# Patient Record
Sex: Male | Born: 1955
Health system: Southern US, Community
[De-identification: ages and names within clinical notes are randomized; demographics above are authoritative.]

## PROBLEM LIST (undated history)

## (undated) DIAGNOSIS — M109 Gout, unspecified: Secondary | ICD-10-CM

## (undated) DIAGNOSIS — I10 Essential (primary) hypertension: Secondary | ICD-10-CM

## (undated) DIAGNOSIS — K219 Gastro-esophageal reflux disease without esophagitis: Secondary | ICD-10-CM

## (undated) HISTORY — PX: KNEE SURGERY: SHX244

---

## 2001-01-01 ENCOUNTER — Encounter: Payer: Self-pay | Admitting: Family Medicine

## 2001-01-01 ENCOUNTER — Encounter: Admission: RE | Admit: 2001-01-01 | Discharge: 2001-01-01 | Payer: Self-pay | Admitting: Family Medicine

## 2001-03-09 ENCOUNTER — Ambulatory Visit (HOSPITAL_COMMUNITY): Admission: RE | Admit: 2001-03-09 | Discharge: 2001-03-09 | Payer: Self-pay | Admitting: Gastroenterology

## 2006-09-14 ENCOUNTER — Ambulatory Visit (HOSPITAL_BASED_OUTPATIENT_CLINIC_OR_DEPARTMENT_OTHER): Admission: RE | Admit: 2006-09-14 | Discharge: 2006-09-14 | Payer: Self-pay | Admitting: Orthopedic Surgery

## 2013-03-31 ENCOUNTER — Other Ambulatory Visit: Payer: Self-pay | Admitting: Gastroenterology

## 2015-06-12 ENCOUNTER — Other Ambulatory Visit: Payer: Self-pay | Admitting: Dermatology

## 2015-10-15 ENCOUNTER — Other Ambulatory Visit: Payer: Self-pay | Admitting: Dermatology

## 2015-12-04 ENCOUNTER — Emergency Department (HOSPITAL_COMMUNITY)
Admission: EM | Admit: 2015-12-04 | Discharge: 2015-12-04 | Disposition: A | Discharge status: Home / Self Care | Payer: BLUE CROSS/BLUE SHIELD | Attending: Emergency Medicine | Admitting: Emergency Medicine

## 2015-12-04 ENCOUNTER — Encounter (HOSPITAL_COMMUNITY): Payer: Self-pay

## 2015-12-04 ENCOUNTER — Emergency Department (HOSPITAL_COMMUNITY): Payer: BLUE CROSS/BLUE SHIELD

## 2015-12-04 DIAGNOSIS — I10 Essential (primary) hypertension: Secondary | ICD-10-CM | POA: Insufficient documentation

## 2015-12-04 DIAGNOSIS — Z79899 Other long term (current) drug therapy: Secondary | ICD-10-CM | POA: Diagnosis not present

## 2015-12-04 DIAGNOSIS — R0789 Other chest pain: Secondary | ICD-10-CM | POA: Diagnosis not present

## 2015-12-04 DIAGNOSIS — R1012 Left upper quadrant pain: Secondary | ICD-10-CM | POA: Diagnosis not present

## 2015-12-04 DIAGNOSIS — R0781 Pleurodynia: Secondary | ICD-10-CM

## 2015-12-04 DIAGNOSIS — Z7982 Long term (current) use of aspirin: Secondary | ICD-10-CM | POA: Diagnosis not present

## 2015-12-04 DIAGNOSIS — R079 Chest pain, unspecified: Secondary | ICD-10-CM | POA: Diagnosis not present

## 2015-12-04 HISTORY — DX: Essential (primary) hypertension: I10

## 2015-12-04 LAB — URINALYSIS, ROUTINE W REFLEX MICROSCOPIC
BILIRUBIN URINE: NEGATIVE
GLUCOSE, UA: NEGATIVE mg/dL
Hgb urine dipstick: NEGATIVE
KETONES UR: NEGATIVE mg/dL
Leukocytes, UA: NEGATIVE
NITRITE: NEGATIVE
PROTEIN: NEGATIVE mg/dL
Specific Gravity, Urine: 1.012 (ref 1.005–1.030)
pH: 6.5 (ref 5.0–8.0)

## 2015-12-04 LAB — COMPREHENSIVE METABOLIC PANEL
ALBUMIN: 3.7 g/dL (ref 3.5–5.0)
ALT: 34 U/L (ref 17–63)
AST: 41 U/L (ref 15–41)
Alkaline Phosphatase: 95 U/L (ref 38–126)
Anion gap: 11 (ref 5–15)
BILIRUBIN TOTAL: 1.2 mg/dL (ref 0.3–1.2)
BUN: 7 mg/dL (ref 6–20)
CO2: 24 mmol/L (ref 22–32)
CREATININE: 0.69 mg/dL (ref 0.61–1.24)
Calcium: 10.2 mg/dL (ref 8.9–10.3)
Chloride: 102 mmol/L (ref 101–111)
GFR calc Af Amer: >60 mL/min (ref >60)
GLUCOSE: 103 mg/dL — AB (ref 65–99)
POTASSIUM: 4.1 mmol/L (ref 3.5–5.1)
Sodium: 137 mmol/L (ref 135–145)
TOTAL PROTEIN: 7.7 g/dL (ref 6.5–8.1)

## 2015-12-04 LAB — CBC
HCT: 43.1 % (ref 39.0–52.0)
Hemoglobin: 15.1 g/dL (ref 13.0–17.0)
MCH: 34.9 pg — ABNORMAL HIGH (ref 26.0–34.0)
MCHC: 35 g/dL (ref 30.0–36.0)
MCV: 99.5 fL (ref 78.0–100.0)
Platelets: 86 10*3/uL — ABNORMAL LOW (ref 150–400)
RBC: 4.33 MIL/uL (ref 4.22–5.81)
RDW: 12.9 % (ref 11.5–15.5)
WBC: 7.3 10*3/uL (ref 4.0–10.5)

## 2015-12-04 LAB — LIPASE, BLOOD: Lipase: 30 U/L (ref 11–51)

## 2015-12-04 LAB — D-DIMER, QUANTITATIVE: D-Dimer, Quant: 0.72 ug/mL-FEU — ABNORMAL HIGH (ref 0.00–0.50)

## 2015-12-04 MED ORDER — PREDNISONE 50 MG PO TABS: 50.0000 mg | ORAL_TABLET | Freq: Every day | ORAL | Status: AC

## 2015-12-04 MED ORDER — IOPAMIDOL (ISOVUE-370) INJECTION 76%
INTRAVENOUS | Status: AC
  Administered 2015-12-04: 100 mL
  Filled 2015-12-04: qty 100

## 2015-12-04 MED ORDER — GUAIFENESIN ER 1200 MG PO TB12: 1.0000 | ORAL_TABLET | Freq: Two times a day (BID) | ORAL | Status: AC

## 2015-12-04 MED ORDER — ACETAMINOPHEN-CODEINE 120-12 MG/5ML PO SOLN: 10.0000 mL | ORAL | Status: AC | PRN

## 2015-12-04 NOTE — Discharge Instructions (Signed)
Return here as needed.  Follow-up with your primary care doctor °

## 2015-12-04 NOTE — ED Provider Notes (Signed)
CSN: [id number]     Arrival date & time 12/04/15  0713 History   First MD Initiated Contact with Patient 12/04/15 0820     Chief Complaint  Patient presents with  . left side pain      (Consider location/radiation/quality/duration/timing/severity/associated sxs/prior Treatment) HPI Patient presents to the emergency department with left side pain that started last night.  The patient states that he developed this pain would resolve, but woke up at 2 AM with the pain.  States that he did take some over-the-counter medication without relief of the symptoms.  Patient states that he has more pain with deep breathing.  He states that he has pain at the lower left ribs and upper abdominal area. The patient denies shortness of breath, headache,blurred vision, neck pain, fever, cough, weakness, numbness, dizziness, anorexia, edema, nausea, vomiting, diarrhea, rash, back pain, dysuria, hematemesis, bloody stool, near syncope, or syncope. Past Medical History  Diagnosis Date  . Hypertension    History reviewed. No pertinent past surgical history. No family history on file. Social History  Substance Use Topics  . Smoking status: Never Smoker   . Smokeless tobacco: None  . Alcohol Use: None    Review of Systems  All other systems negative except as documented in the HPI. All pertinent positives and negatives as reviewed in the HPI.  Allergies  Review of patient's allergies indicates not on file.  Home Medications   Prior to Admission medications   Medication Sig Start Date End Date Taking? Authorizing Provider  allopurinol (ZYLOPRIM) 300 MG tablet Take 300 mg by mouth daily. 09/03/15  Yes Historical Provider, MD  aspirin 81 MG tablet Take 81 mg by mouth daily.   Yes Historical Provider, MD  cholecalciferol (VITAMIN D) 1000 units tablet Take 1,000 Units by mouth daily.   Yes Historical Provider, MD  CIALIS 5 MG tablet Take 5 mg by mouth daily as needed. Erectile dysfunction 10/06/15  Yes  Historical Provider, MD  KRILL OIL PO Take 1 capsule by mouth daily.   Yes Historical Provider, MD  losartan (COZAAR) 50 MG tablet Take 50 mg by mouth daily. 12/02/15  Yes Historical Provider, MD  Multiple Vitamins-Minerals (ZINC PO) Take 1 capsule by mouth daily.   Yes Historical Provider, MD  vitamin E 400 UNIT capsule Take 400 Units by mouth daily.   Yes Historical Provider, MD   BP 150/78 mmHg  Pulse 68  Temp(Src) 98.6 F (37 C) (Oral)  Resp 15  Ht 5\' 10"  (1.778 m)  Wt 113.399 kg  BMI 35.87 kg/m2  SpO2 95% Physical Exam  Constitutional: He is oriented to person, place, and time. He appears well-developed and well-nourished. No distress.  HENT:  Head: Normocephalic and atraumatic.  Mouth/Throat: Oropharynx is clear and moist.  Eyes: Pupils are equal, round, and reactive to light.  Neck: Normal range of motion. Neck supple.  Cardiovascular: Normal rate, regular rhythm and normal heart sounds.  Exam reveals no gallop and no friction rub.   No murmur heard. Pulmonary/Chest: Effort normal and breath sounds normal. No respiratory distress. He has no wheezes. He exhibits no tenderness.  Abdominal: Soft. Bowel sounds are normal. He exhibits no distension. There is no tenderness.  Musculoskeletal: He exhibits no edema.  Neurological: He is alert and oriented to person, place, and time. He exhibits normal muscle tone. Coordination normal.  Skin: Skin is warm and dry. No rash noted. No erythema.  Psychiatric: He has a normal mood and affect. His behavior is normal.  Nursing  note and vitals reviewed.   ED Course  Procedures (including critical care time) Labs Review Labs Reviewed  COMPREHENSIVE METABOLIC PANEL - Abnormal; Notable for the following:    Glucose, Bld 103 (*)    All other components within normal limits  CBC - Abnormal; Notable for the following:    MCH 34.9 (*)    Platelets 86 (*)    All other components within normal limits  D-DIMER, QUANTITATIVE (NOT AT [facility]) -  Abnormal; Notable for the following:    D-Dimer, Quant 0.72 (*)    All other components within normal limits  LIPASE, BLOOD  URINALYSIS, ROUTINE W REFLEX MICROSCOPIC (NOT AT [facility])    Imaging Review Dg Chest 2 View  12/04/2015  CLINICAL DATA:  Hypertension, cough for 1 week with worsening today, left lower chest pain EXAM: CHEST  2 VIEW COMPARISON:  None. FINDINGS: Borderline cardiomegaly. No acute infiltrate or pleural effusion. No pulmonary edema. Degenerative changes mid and lower thoracic spine. IMPRESSION: No active cardiopulmonary disease. Borderline cardiomegaly. Degenerative changes thoracic spine. Electronically Signed   By: Lahoma Crocker M.D.   On: 12/04/2015 09:09   Ct Angio Chest Pe W/cm &/or Wo Cm  12/04/2015  CLINICAL DATA:  60 year old male with left upper quadrant abdominal pain radiating to the back. Pleuritic pain. Initial encounter. EXAM: CT ANGIOGRAPHY CHEST CT ABDOMEN AND PELVIS WITH CONTRAST TECHNIQUE: Multidetector CT imaging of the chest was performed using the standard protocol during bolus administration of intravenous contrast. Multiplanar CT image reconstructions and MIPs were obtained to evaluate the vascular anatomy. Multidetector CT imaging of the abdomen and pelvis was performed using the standard protocol during bolus administration of intravenous contrast. CONTRAST:  100 mL Isovue 370 COMPARISON:  Chest radiographs 0909 hours today. FINDINGS: CTA CHEST FINDINGS Adequate contrast bolus timing in the pulmonary arterial tree. Motion artifact at both lung bases. This limits detail of the bilateral lower lobe and middle lobe distal pulmonary artery branches. No focal filling defect identified in the pulmonary arterial tree to suggest the presence of acute pulmonary embolism. Major airways are patent. Mild obscuration of lung markings throughout the lower chest due to the respiratory motion improved visualization of the lung bases on the dedicated abdomen images. Minimal dependent  atelectasis. No consolidation or confluent pulmonary opacity. No pleural effusion. No pericardial effusion. Calcified coronary artery atherosclerosis. Only minimal calcified aortic atherosclerosis in the chest. No thoracic lymphadenopathy. Widespread degenerative endplate spurring in the thoracic spine. Chronic left lateral seventh rib fracture. No acute osseous abnormality identified. CT ABDOMEN and PELVIS FINDINGS Degenerative endplate changes in the lumbar spine, including and upper lumbar inferior endplate Schmorl node as seen on sagittal image 73. Advanced degenerative changes at the medial right pubic symphysis with vacuum phenomena and osteophytosis. No acute osseous abnormality identified. No pelvic free fluid. Moderately distended but otherwise unremarkable urinary bladder. There is fluid in the distal colon and rectum which otherwise appears normal. Redundant but otherwise negative sigmoid colon. Decompressed left colon. There is some fluid in the transverse colon. Fluid at the hepatic flexure and in the ascending colon. No large intestine inflammation identified. Negative terminal ileum. Appendix not identified. No dilated small bowel. Decompressed stomach. Negative duodenum. No abdominal free air. No free fluid. Mildly distended but otherwise negative gallbladder. No pericholecystic inflammation. Mild decreased density throughout the liver in keeping with steatosis. Mildly nodular liver contour. Spleen size at the upper limits of normal. Portal venous system is patent. Negative pancreas and adrenal glands. Aortoiliac calcified atherosclerosis noted. Major arterial  structures are patent in the abdomen and pelvis. Bilateral renal contrast enhancement and excretion is normal. No lymphadenopathy. There is suggestion of some perigastric varices, most apparent in the gastro pad Eck ligament. Also the paraumbilical vein may be recannulized. Review of the MIP images confirms the above findings. IMPRESSION: 1.  Mildly motion degraded chest CTA with no acute pulmonary embolus identified. 2. No acute findings identified in the chest. 3. Calcified coronary atherosclerosis. 4. Suggestion of hepatic cirrhosis with small caliber perigastric varices. Is there known chronic liver disease? 5. No other acute or inflammatory process identified in the abdomen or pelvis. 6. Calcified aortic atherosclerosis. Electronically Signed   By: Genevie Ann M.D.   On: 12/04/2015 11:15   Ct Abdomen Pelvis W Contrast  12/04/2015  CLINICAL DATA:  60 year old male with left upper quadrant abdominal pain radiating to the back. Pleuritic pain. Initial encounter. EXAM: CT ANGIOGRAPHY CHEST CT ABDOMEN AND PELVIS WITH CONTRAST TECHNIQUE: Multidetector CT imaging of the chest was performed using the standard protocol during bolus administration of intravenous contrast. Multiplanar CT image reconstructions and MIPs were obtained to evaluate the vascular anatomy. Multidetector CT imaging of the abdomen and pelvis was performed using the standard protocol during bolus administration of intravenous contrast. CONTRAST:  100 mL Isovue 370 COMPARISON:  Chest radiographs 0909 hours today. FINDINGS: CTA CHEST FINDINGS Adequate contrast bolus timing in the pulmonary arterial tree. Motion artifact at both lung bases. This limits detail of the bilateral lower lobe and middle lobe distal pulmonary artery branches. No focal filling defect identified in the pulmonary arterial tree to suggest the presence of acute pulmonary embolism. Major airways are patent. Mild obscuration of lung markings throughout the lower chest due to the respiratory motion improved visualization of the lung bases on the dedicated abdomen images. Minimal dependent atelectasis. No consolidation or confluent pulmonary opacity. No pleural effusion. No pericardial effusion. Calcified coronary artery atherosclerosis. Only minimal calcified aortic atherosclerosis in the chest. No thoracic lymphadenopathy.  Widespread degenerative endplate spurring in the thoracic spine. Chronic left lateral seventh rib fracture. No acute osseous abnormality identified. CT ABDOMEN and PELVIS FINDINGS Degenerative endplate changes in the lumbar spine, including and upper lumbar inferior endplate Schmorl node as seen on sagittal image 73. Advanced degenerative changes at the medial right pubic symphysis with vacuum phenomena and osteophytosis. No acute osseous abnormality identified. No pelvic free fluid. Moderately distended but otherwise unremarkable urinary bladder. There is fluid in the distal colon and rectum which otherwise appears normal. Redundant but otherwise negative sigmoid colon. Decompressed left colon. There is some fluid in the transverse colon. Fluid at the hepatic flexure and in the ascending colon. No large intestine inflammation identified. Negative terminal ileum. Appendix not identified. No dilated small bowel. Decompressed stomach. Negative duodenum. No abdominal free air. No free fluid. Mildly distended but otherwise negative gallbladder. No pericholecystic inflammation. Mild decreased density throughout the liver in keeping with steatosis. Mildly nodular liver contour. Spleen size at the upper limits of normal. Portal venous system is patent. Negative pancreas and adrenal glands. Aortoiliac calcified atherosclerosis noted. Major arterial structures are patent in the abdomen and pelvis. Bilateral renal contrast enhancement and excretion is normal. No lymphadenopathy. There is suggestion of some perigastric varices, most apparent in the gastro pad Eck ligament. Also the paraumbilical vein may be recannulized. Review of the MIP images confirms the above findings. IMPRESSION: 1. Mildly motion degraded chest CTA with no acute pulmonary embolus identified. 2. No acute findings identified in the chest. 3. Calcified coronary atherosclerosis.  4. Suggestion of hepatic cirrhosis with small caliber perigastric varices. Is  there known chronic liver disease? 5. No other acute or inflammatory process identified in the abdomen or pelvis. 6. Calcified aortic atherosclerosis. Electronically Signed   By: Genevie Ann M.D.   On: 12/04/2015 11:15   I have personally reviewed and evaluated these images and lab results as part of my medical decision-making.   EKG Interpretation   Date/Time:  Tuesday Dec 04 2015 09:14:13 EDT Ventricular Rate:  61 PR Interval:  136 QRS Duration: 93 QT Interval:  402 QTC Calculation: 405 R Axis:   -30 Text Interpretation:  Sinus rhythm Left axis deviation No significant  change since last tracing Confirmed by Alvino Chapel  MD, Ovid Curd [phone]) on  12/04/2015 9:18:01 AM Also confirmed by Alvino Chapel  MD, NATHAN [phone]),  editor [place] CT, Leda Gauze [phone])  on 12/04/2015 9:23:58 AM      MDM   Final diagnoses:  None     Was concern for blood clot and intra-abdominal issues since the pain was located in the left upper quadrant and left lower chest region.  The patient does not have any CTA findings of PE and no significant intra-abdominal issues.  The patient is advised to follow up with his primary care Dr. told to return here as needed.  Patient agrees the plan and all questions were answered feel that this pain is most likely related to muscular irritation of the lower chest wall/pleuritic pleurisy type pain  Dalia Heading, PA-C 12/06/15 [place], MD 12/07/15 1859

## 2015-12-04 NOTE — ED Notes (Signed)
Pt transported to xray 

## 2015-12-04 NOTE — ED Notes (Signed)
Patient here with left side pain x 1 day, denies trauma. Nausea and some diarrhea with same. Pain slightly worse with ambulation

## 2016-03-26 DIAGNOSIS — N400 Enlarged prostate without lower urinary tract symptoms: Secondary | ICD-10-CM | POA: Diagnosis not present

## 2016-03-26 DIAGNOSIS — J309 Allergic rhinitis, unspecified: Secondary | ICD-10-CM | POA: Diagnosis not present

## 2016-03-26 DIAGNOSIS — I10 Essential (primary) hypertension: Secondary | ICD-10-CM | POA: Diagnosis not present

## 2016-03-26 DIAGNOSIS — Z0000 Encounter for general adult medical examination without abnormal findings: Secondary | ICD-10-CM | POA: Diagnosis not present

## 2016-03-26 DIAGNOSIS — M109 Gout, unspecified: Secondary | ICD-10-CM | POA: Diagnosis not present

## 2016-11-13 DIAGNOSIS — S46912A Strain of unspecified muscle, fascia and tendon at shoulder and upper arm level, left arm, initial encounter: Secondary | ICD-10-CM | POA: Diagnosis not present

## 2017-03-13 DIAGNOSIS — M109 Gout, unspecified: Secondary | ICD-10-CM | POA: Diagnosis not present

## 2017-04-27 DIAGNOSIS — Z13220 Encounter for screening for lipoid disorders: Secondary | ICD-10-CM | POA: Diagnosis not present

## 2017-04-27 DIAGNOSIS — Z0000 Encounter for general adult medical examination without abnormal findings: Secondary | ICD-10-CM | POA: Diagnosis not present

## 2017-04-27 DIAGNOSIS — Z23 Encounter for immunization: Secondary | ICD-10-CM | POA: Diagnosis not present

## 2017-07-29 DIAGNOSIS — R21 Rash and other nonspecific skin eruption: Secondary | ICD-10-CM | POA: Diagnosis not present

## 2017-10-20 DIAGNOSIS — L570 Actinic keratosis: Secondary | ICD-10-CM | POA: Diagnosis not present

## 2017-10-20 DIAGNOSIS — D229 Melanocytic nevi, unspecified: Secondary | ICD-10-CM | POA: Diagnosis not present

## 2017-10-20 DIAGNOSIS — L409 Psoriasis, unspecified: Secondary | ICD-10-CM | POA: Diagnosis not present

## 2017-11-23 ENCOUNTER — Other Ambulatory Visit: Payer: Self-pay | Admitting: Dermatology

## 2017-11-23 DIAGNOSIS — D0439 Carcinoma in situ of skin of other parts of face: Secondary | ICD-10-CM | POA: Diagnosis not present

## 2017-11-23 DIAGNOSIS — L309 Dermatitis, unspecified: Secondary | ICD-10-CM | POA: Diagnosis not present

## 2018-01-07 DIAGNOSIS — D0439 Carcinoma in situ of skin of other parts of face: Secondary | ICD-10-CM | POA: Diagnosis not present

## 2018-01-07 DIAGNOSIS — L409 Psoriasis, unspecified: Secondary | ICD-10-CM | POA: Diagnosis not present

## 2018-01-07 DIAGNOSIS — L570 Actinic keratosis: Secondary | ICD-10-CM | POA: Diagnosis not present

## 2018-03-01 DIAGNOSIS — L259 Unspecified contact dermatitis, unspecified cause: Secondary | ICD-10-CM | POA: Diagnosis not present

## 2018-03-04 DIAGNOSIS — L259 Unspecified contact dermatitis, unspecified cause: Secondary | ICD-10-CM | POA: Diagnosis not present

## 2018-04-28 DIAGNOSIS — I10 Essential (primary) hypertension: Secondary | ICD-10-CM | POA: Diagnosis not present

## 2018-04-28 DIAGNOSIS — D696 Thrombocytopenia, unspecified: Secondary | ICD-10-CM | POA: Diagnosis not present

## 2018-04-28 DIAGNOSIS — Z0000 Encounter for general adult medical examination without abnormal findings: Secondary | ICD-10-CM | POA: Diagnosis not present

## 2018-04-28 DIAGNOSIS — Z23 Encounter for immunization: Secondary | ICD-10-CM | POA: Diagnosis not present

## 2018-04-28 DIAGNOSIS — E781 Pure hyperglyceridemia: Secondary | ICD-10-CM | POA: Diagnosis not present

## 2018-04-28 DIAGNOSIS — M109 Gout, unspecified: Secondary | ICD-10-CM | POA: Diagnosis not present

## 2018-07-20 ENCOUNTER — Other Ambulatory Visit: Payer: Self-pay

## 2018-07-20 ENCOUNTER — Emergency Department (HOSPITAL_BASED_OUTPATIENT_CLINIC_OR_DEPARTMENT_OTHER): Payer: BLUE CROSS/BLUE SHIELD

## 2018-07-20 ENCOUNTER — Encounter (HOSPITAL_BASED_OUTPATIENT_CLINIC_OR_DEPARTMENT_OTHER): Payer: Self-pay | Admitting: *Deleted

## 2018-07-20 ENCOUNTER — Emergency Department (HOSPITAL_BASED_OUTPATIENT_CLINIC_OR_DEPARTMENT_OTHER)
Admission: EM | Admit: 2018-07-20 | Discharge: 2018-07-21 | Disposition: A | Discharge status: Home / Self Care | Payer: BLUE CROSS/BLUE SHIELD | Attending: Emergency Medicine | Admitting: Emergency Medicine

## 2018-07-20 DIAGNOSIS — K8020 Calculus of gallbladder without cholecystitis without obstruction: Secondary | ICD-10-CM

## 2018-07-20 DIAGNOSIS — R110 Nausea: Secondary | ICD-10-CM | POA: Diagnosis not present

## 2018-07-20 DIAGNOSIS — K7460 Unspecified cirrhosis of liver: Secondary | ICD-10-CM | POA: Diagnosis not present

## 2018-07-20 DIAGNOSIS — I10 Essential (primary) hypertension: Secondary | ICD-10-CM | POA: Diagnosis not present

## 2018-07-20 DIAGNOSIS — R1011 Right upper quadrant pain: Secondary | ICD-10-CM

## 2018-07-20 DIAGNOSIS — R1013 Epigastric pain: Secondary | ICD-10-CM | POA: Diagnosis present

## 2018-07-20 HISTORY — DX: Gastro-esophageal reflux disease without esophagitis: K21.9

## 2018-07-20 HISTORY — DX: Gout, unspecified: M10.9

## 2018-07-20 LAB — COMPREHENSIVE METABOLIC PANEL
ALBUMIN: 4 g/dL (ref 3.5–5.0)
ALK PHOS: 92 U/L (ref 38–126)
ALT: 33 U/L (ref 0–44)
ANION GAP: 10 (ref 5–15)
AST: 39 U/L (ref 15–41)
BUN: 14 mg/dL (ref 8–23)
CALCIUM: 9.2 mg/dL (ref 8.9–10.3)
CO2: 22 mmol/L (ref 22–32)
Chloride: 100 mmol/L (ref 98–111)
Creatinine, Ser: 0.92 mg/dL (ref 0.61–1.24)
GFR calc Af Amer: >60 mL/min (ref >60)
GFR calc non Af Amer: >60 mL/min (ref >60)
GLUCOSE: 111 mg/dL — AB (ref 70–99)
Potassium: 3.5 mmol/L (ref 3.5–5.1)
SODIUM: 132 mmol/L — AB (ref 135–145)
Total Bilirubin: 1.5 mg/dL — ABNORMAL HIGH (ref 0.3–1.2)
Total Protein: 7.8 g/dL (ref 6.5–8.1)

## 2018-07-20 LAB — CBC
HCT: 45.6 % (ref 39.0–52.0)
Hemoglobin: 15.9 g/dL (ref 13.0–17.0)
MCH: 35.4 pg — AB (ref 26.0–34.0)
MCHC: 34.9 g/dL (ref 30.0–36.0)
MCV: 101.6 fL — AB (ref 80.0–100.0)
PLATELETS: 72 10*3/uL — AB (ref 150–400)
RBC: 4.49 MIL/uL (ref 4.22–5.81)
RDW: 12.7 % (ref 11.5–15.5)
WBC: 10.1 10*3/uL (ref 4.0–10.5)
nRBC: 0 % (ref 0.0–0.2)

## 2018-07-20 LAB — TROPONIN I: Troponin I: <0.03 ng/mL (ref <0.03)

## 2018-07-20 LAB — LIPASE, BLOOD: Lipase: 43 U/L (ref 11–51)

## 2018-07-20 MED ORDER — ALUM & MAG HYDROXIDE-SIMETH 200-200-20 MG/5ML PO SUSP
30.0000 mL | Freq: Once | ORAL | Status: AC
  Administered 2018-07-20: 30 mL via ORAL
  Filled 2018-07-20: qty 30

## 2018-07-20 MED ORDER — IOPAMIDOL (ISOVUE-300) INJECTION 61%
100.0000 mL | Freq: Once | INTRAVENOUS | Status: AC | PRN
  Administered 2018-07-20: 100 mL via INTRAVENOUS

## 2018-07-20 MED ORDER — MORPHINE SULFATE (PF) 4 MG/ML IV SOLN
4.0000 mg | Freq: Once | INTRAVENOUS | Status: AC
  Administered 2018-07-20: 4 mg via INTRAVENOUS
  Filled 2018-07-20: qty 1

## 2018-07-20 MED ORDER — SODIUM CHLORIDE 0.9 % IV BOLUS
500.0000 mL | Freq: Once | INTRAVENOUS | Status: AC
  Administered 2018-07-20: 500 mL via INTRAVENOUS

## 2018-07-20 MED ORDER — ONDANSETRON HCL 4 MG/2ML IJ SOLN
4.0000 mg | Freq: Once | INTRAMUSCULAR | Status: AC
  Administered 2018-07-20: 4 mg via INTRAVENOUS
  Filled 2018-07-20: qty 2

## 2018-07-20 MED ORDER — LIDOCAINE VISCOUS HCL 2 % MT SOLN
15.0000 mL | Freq: Once | OROMUCOSAL | Status: AC
  Administered 2018-07-20: 15 mL via ORAL
  Filled 2018-07-20: qty 15

## 2018-07-20 MED ORDER — METOCLOPRAMIDE HCL 5 MG/ML IJ SOLN
10.0000 mg | Freq: Once | INTRAMUSCULAR | Status: AC
  Administered 2018-07-20: 10 mg via INTRAVENOUS
  Filled 2018-07-20: qty 2

## 2018-07-20 NOTE — ED Notes (Addendum)
Pt ambulated with assistance of wife to bathroom. Pt states he had bowel movement that was watery with no formed stool and black in color. Pt states he took pepto bismol at approx 1730 today

## 2018-07-20 NOTE — ED Notes (Signed)
Pt is vomiting, requesting additional pain medication and additional medication for nausea.

## 2018-07-20 NOTE — Discharge Instructions (Addendum)
You have been seen in the Emergency Department (ED) for abdominal pain.  Your evaluation did not identify a clear cause of your symptoms but was generally reassuring.  You do have evidence of chronic liver damage on the CT scan but your liver tests are normal. The ultrasound shows sludge and a stone in the gallbladder. This could cause pain and I have listed the name of a surgery office to call in the morning to schedule a follow up appointment.   Please follow up as instructed above regarding todays emergent visit and the symptoms that are bothering you.  Return to the ED if your abdominal pain worsens or fails to improve, you develop bloody vomiting, bloody diarrhea, you are unable to tolerate fluids due to vomiting, fever greater than 101, or other symptoms that concern you.

## 2018-07-20 NOTE — ED Provider Notes (Signed)
Emergency Department Provider Note   I have reviewed the triage vital signs and the nursing notes.   HISTORY  Chief Complaint Abdominal Pain   HPI Johnny Coleman is a 62 y.o. male with PMH of HTN and GERD presents to the emergency department for evaluation of severe epigastric pain.  Pain began suddenly this afternoon.  He states it feels sharp and radiates to the back.  No radiation of pain symptoms into the chest or lower abdomen.  He has had associated nausea and vomiting.  He states with episodes of emesis he does feel temporary relief in pain symptoms but then this returns.  He does not see a clear association with eating.  No prior history of similar pain.  No numbness or tingling in his lower or upper extremities. No fever or chills. Emesis is non-bloody.  Patient with one episode of diarrhea prior to leaving for the emergency department.   Past Medical History:  Diagnosis Date  . GERD (gastroesophageal reflux disease)   . Gout   . Hypertension     There are no active problems to display for this patient.   Past Surgical History:  Procedure Laterality Date  . KNEE SURGERY      Allergies Patient has no known allergies.  No family history on file.  Social History Social History   Tobacco Use  . Smoking status: Never Smoker  . Smokeless tobacco: Never Used  Substance Use Topics  . Alcohol use: Yes  . Drug use: Never    Review of Systems  Constitutional: No fever/chills Eyes: No visual changes. ENT: No sore throat. Cardiovascular: Denies chest pain. Respiratory: Denies shortness of breath. Gastrointestinal: Positive epigastric abdominal pain. Positive nausea, vomiting, and diarrhea.  No constipation. Genitourinary: Negative for dysuria. Musculoskeletal: Negative for back pain. Skin: Negative for rash. Neurological: Negative for headaches, focal weakness or numbness.  10-point ROS otherwise  negative.  ____________________________________________   PHYSICAL EXAM:  VITAL SIGNS: ED Triage Vitals  Enc Vitals Group     BP 07/20/18 1848 124/65     Pulse Rate 07/20/18 1846 82     Resp 07/20/18 1846 18     Temp 07/20/18 1846 97.6 F (36.4 C)     Temp Source 07/20/18 1846 Oral     SpO2 07/20/18 1846 100 %     Weight 07/20/18 1843 250 lb (113.4 kg)     Height 07/20/18 1843 5\' 10"  (1.778 m)     Pain Score 07/20/18 1843 8   Constitutional: Alert and oriented. Well appearing and in no acute distress. Eyes: Conjunctivae are normal.  Head: Atraumatic. Nose: No congestion/rhinnorhea. Mouth/Throat: Mucous membranes are moist.   Neck: No stridor.   Cardiovascular: Normal rate, regular rhythm. Good peripheral circulation. Grossly normal heart sounds.   Respiratory: Normal respiratory effort.  No retractions. Lungs CTAB. Gastrointestinal: Soft with focal mid-epigastric and RUQ tenderness. No LUQ tenderness. No distention.  Musculoskeletal: No lower extremity tenderness nor edema. No gross deformities of extremities. Neurologic:  Normal speech and language. No gross focal neurologic deficits are appreciated.  Skin:  Skin is warm, dry and intact. No rash noted.  ____________________________________________   LABS (all labs ordered are listed, but only abnormal results are displayed)  Labs Reviewed  COMPREHENSIVE METABOLIC PANEL - Abnormal; Notable for the following components:      Result Value   Sodium 132 (*)    Glucose, Bld 111 (*)    Total Bilirubin 1.5 (*)    All other components within  normal limits  CBC - Abnormal; Notable for the following components:   MCV 101.6 (*)    MCH 35.4 (*)    Platelets 72 (*)    All other components within normal limits  LIPASE, BLOOD  TROPONIN I   ____________________________________________  EKG   EKG Interpretation  Date/Time:  Tuesday July 20 2018 18:55:20 EST Ventricular Rate:  75 PR Interval:    QRS Duration: 92 QT  Interval:  395 QTC Calculation: 442 R Axis:   -19 Text Interpretation:  Sinus rhythm Borderline left axis deviation No STEMI.  Confirmed by Nanda Quinton [phone]) on 07/20/2018 7:18:21 PM Also confirmed by Nanda Quinton [phone]), editor Philomena Doheny [phone])  on 07/21/2018 7:35:22 AM       ____________________________________________  RADIOLOGY  Ct Abdomen Pelvis W Contrast  Result Date: 07/20/2018 CLINICAL DATA:  Sudden onset mid abdominal pain for the past 2 hours with vomiting. EXAM: CT ABDOMEN AND PELVIS WITH CONTRAST TECHNIQUE: Multidetector CT imaging of the abdomen and pelvis was performed using the standard protocol following bolus administration of intravenous contrast. CONTRAST:  134mL ISOVUE-300 IOPAMIDOL (ISOVUE-300) INJECTION 61% COMPARISON:  CT abdomen pelvis dated Dec 04, 2015. FINDINGS: Lower chest: No acute abnormality. Hepatobiliary: Unchanged cirrhotic liver. No focal liver abnormality. The gallbladder is mildly distended and contains a tiny gallstone. No gallbladder wall thickening or biliary dilatation. Pancreas: Unremarkable. No pancreatic ductal dilatation or surrounding inflammatory changes. Spleen: Normal in size without focal abnormality. Adrenals/Urinary Tract: Adrenal glands are unremarkable. Kidneys are normal, without renal calculi, focal lesion, or hydronephrosis. Bladder is unremarkable. Stomach/Bowel: Stomach is within normal limits. Appendix appears normal. No evidence of bowel wall thickening, distention, or inflammatory changes. Fluid-filled colon. Mild sigmoid diverticulosis. Vascular/Lymphatic: Unchanged splenorenal shunt and perigastric varices. Aortic atherosclerosis. No enlarged abdominal or pelvic lymph nodes. Reproductive: Prostate is unremarkable. Other: No abdominal wall hernia or abnormality. No abdominopelvic ascites. No pneumoperitoneum. Musculoskeletal: No acute or significant osseous findings. New Schmorl's node involving the L5 superior endplate.  IMPRESSION: 1.  No acute intra-abdominal process. 2. Cirrhosis with evidence of portal hypertension.  No ascites. 3. Cholelithiasis. 4.  Aortic atherosclerosis (ICD10-I70.0). Electronically Signed   By: Titus Dubin M.D.   On: 07/20/2018 20:36   US Abdomen Limited Ruq  Result Date: 07/20/2018 CLINICAL DATA:  Right upper quadrant pain.  Nausea and vomiting. EXAM: ULTRASOUND ABDOMEN LIMITED RIGHT UPPER QUADRANT COMPARISON:  CT earlier this day. FINDINGS: Gallbladder: Distended containing intraluminal sludge and tiny calculi. No gallbladder wall thickening. No pericholecystic fluid. Limited assessment for sonographic Percell Miller sign as patient had just received pain medication, no definite sonographic Murphy sign in this setting. Common bile duct: Diameter: 2 mm. Liver: Heterogeneous and increased echogenicity. Nodular hepatic contours. No discrete focal lesion demonstrated sonographically. Portal vein is patent on color Doppler imaging with normal direction of blood flow towards the liver. No right upper quadrant ascites. IMPRESSION: 1. Distended gallbladder containing sludge and tiny gallstone. No secondary findings of acute cholecystitis. 2. Hepatic cirrhosis. Electronically Signed   By: Keith Rake M.D.   On: 07/20/2018 23:26    ____________________________________________   PROCEDURES  Procedure(s) performed:   Procedures  None ____________________________________________   INITIAL IMPRESSION / ASSESSMENT AND PLAN / ED COURSE  Pertinent labs & imaging results that were available during my care of the patient were reviewed by me and considered in my medical decision making (see chart for details).  Patient presents to the emergency department with severe epigastric abdominal pain with nausea vomiting and diarrhea at home.  Symptoms appear to be gastrointestinal as opposed to atypical ACS or vascular etiology such as AAA or dissection. Patient with more focal tenderness over the RUQ.  Plan for CT abdomen/pelvis and labs. Patient given pain and nausea medication.   11:42 PM Patient is feeling slightly better after additional pain and nausea medication.  CT scan reviewed which shows liver cirrhosis of which the patient was unaware.  There is a small gallstone and distention of the gallbladder.  Lab work shows mild bilirubin elevation but normal white count and LFTs.  Right upper quadrant ultrasound obtained which shows sludge and distention but no acute cholecystitis.  Given that the patient is feeling slightly better I plan for GI cocktail and PO trial. If tolerating, will have the patient f/u with surgery as an outpatient. Patient and wife are comfortable with this plan.   Care transferred to Dr. Sherry Ruffing who will re-evaluate.  ____________________________________________  FINAL CLINICAL IMPRESSION(S) / ED DIAGNOSES  Final diagnoses:  RUQ pain  Symptomatic cholelithiasis  Nausea     MEDICATIONS GIVEN DURING THIS VISIT:  Medications  sodium chloride 0.9 % bolus 500 mL ( Intravenous Stopped 07/20/18 2013)  morphine 4 MG/ML injection 4 mg (4 mg Intravenous Given 07/20/18 1918)  ondansetron (ZOFRAN) injection 4 mg (4 mg Intravenous Given 07/20/18 1918)  iopamidol (ISOVUE-300) 61 % injection 100 mL (100 mLs Intravenous Contrast Given 07/20/18 2012)  morphine 4 MG/ML injection 4 mg (4 mg Intravenous Given 07/20/18 2046)  ondansetron (ZOFRAN) injection 4 mg (4 mg Intravenous Given 07/20/18 2047)  metoCLOPramide (REGLAN) injection 10 mg (10 mg Intravenous Given 07/20/18 2216)  morphine 4 MG/ML injection 4 mg (4 mg Intravenous Given 07/20/18 2216)  alum & mag hydroxide-simeth (MAALOX/MYLANTA) 200-200-20 MG/5ML suspension 30 mL (30 mLs Oral Given 07/20/18 2352)    And  lidocaine (XYLOCAINE) 2 % viscous mouth solution 15 mL (15 mLs Oral Given 07/20/18 2352)     NEW OUTPATIENT MEDICATIONS STARTED DURING THIS VISIT:  Discharge Medication List as of 07/21/2018 12:46 AM     START taking these medications   Details  ondansetron (ZOFRAN) 4 MG tablet Take 1 tablet (4 mg total) by mouth every 8 (eight) hours as needed for nausea or vomiting., Starting Wed 07/21/2018, Print    oxyCODONE (ROXICODONE) 5 MG immediate release tablet Take 1 tablet (5 mg total) by mouth every 4 (four) hours as needed for severe pain., Starting Wed 07/21/2018, Print        Note:  This document was prepared using Dragon voice recognition software and may include unintentional dictation errors.  Nanda Quinton, MD Emergency Medicine    Bernadean Saling, [facility], MD 07/21/18 [phone]

## 2018-07-20 NOTE — ED Triage Notes (Signed)
Mid abdominal pain x 4 hours. States for 2 months he has constantly clearing his throat. Vomiting started after the pain.

## 2018-07-20 NOTE — ED Notes (Signed)
Pt on monitor 

## 2018-07-20 NOTE — ED Notes (Signed)
Pt states that he had lunch at approx noon today and around 1400 he started having "severe epigastric abdominal pain with vomiting. He states he felt fine earlier today but the nausea came on suddenly with emesis and diaphoresis.

## 2018-07-21 MED ORDER — ONDANSETRON HCL 4 MG PO TABS: 4.0000 mg | ORAL_TABLET | Freq: Three times a day (TID) | ORAL | 0 refills | Status: AC | PRN

## 2018-07-21 MED ORDER — OXYCODONE HCL 5 MG PO TABS: 5.0000 mg | ORAL_TABLET | ORAL | 0 refills | Status: AC | PRN

## 2018-07-21 NOTE — ED Notes (Signed)
PO challenge per EDP. Gave pt saltine crackers and gingerale

## 2018-07-21 NOTE — ED Provider Notes (Signed)
12:09 AM Care assumed from Dr. Laverta Baltimore.  At time of transfer care, patient is awaiting reassessment after symptomatic management medications.  Suspect symptomatic cholelithiasis with nausea vomiting, abdominal pain in the setting of imaging revealing sludge and distended gallbladder.  No evidence of acute cholecystitis.  LFTs were reassuring however bilirubin was slightly more elevated than prior.  Plan of care at time of transfer of care is to p.o. challenge patient.  Patient is able to eat and drink safely and maintain hydration, he will be stable for discharge home for outpatient general surgery follow-up and prescription for pain and nausea medicine.  Patient agrees with this plan of care.  Anticipate reassessment after eating and drinking.  12:46 AM Patient was able to eat and drink without difficulty.  We discussed plan of care.  Patient will follow-up with general surgery and understood return precautions.  Patient to prescription for pain medicine nausea medicine.  Patient had no other questions or concerns and was discharged in good condition with improved symptoms.  Clinical Impression: 1. Symptomatic cholelithiasis   2. RUQ pain   3. Nausea     Disposition: Discharge  Condition: Good  I have discussed the results, Dx and Tx plan with the pt(& family if present). He/she/they expressed understanding and agree(s) with the plan. Discharge instructions discussed at great length. Strict return precautions discussed and pt &/or family have verbalized understanding of the instructions. No further questions at time of discharge.    New Prescriptions   ONDANSETRON (ZOFRAN) 4 MG TABLET    Take 1 tablet (4 mg total) by mouth every 8 (eight) hours as needed for nausea or vomiting.   OXYCODONE (ROXICODONE) 5 MG IMMEDIATE RELEASE TABLET    Take 1 tablet (5 mg total) by mouth every 4 (four) hours as needed for severe pain.    Follow Up: Lawerance Cruel, MD [place] [address] [phone]  Call in 1 day   Surgery, [facility] [address] [place] [address] [phone]  Call in 1 day to schedule a follow up appointment for evaluation of the gallbladder.     Lamario Mani, Gwenyth Allegra, MD 07/21/18 [phone]

## 2018-08-05 DIAGNOSIS — K766 Portal hypertension: Secondary | ICD-10-CM | POA: Diagnosis not present

## 2018-08-05 DIAGNOSIS — D696 Thrombocytopenia, unspecified: Secondary | ICD-10-CM | POA: Diagnosis not present

## 2018-08-05 DIAGNOSIS — K7460 Unspecified cirrhosis of liver: Secondary | ICD-10-CM | POA: Diagnosis not present

## 2018-08-13 DIAGNOSIS — K766 Portal hypertension: Secondary | ICD-10-CM | POA: Diagnosis not present

## 2018-08-13 DIAGNOSIS — D6959 Other secondary thrombocytopenia: Secondary | ICD-10-CM | POA: Diagnosis not present

## 2018-08-13 DIAGNOSIS — K8020 Calculus of gallbladder without cholecystitis without obstruction: Secondary | ICD-10-CM | POA: Diagnosis not present

## 2018-08-13 DIAGNOSIS — K7030 Alcoholic cirrhosis of liver without ascites: Secondary | ICD-10-CM | POA: Diagnosis not present

## 2018-11-04 DIAGNOSIS — K7460 Unspecified cirrhosis of liver: Secondary | ICD-10-CM | POA: Diagnosis not present

## 2018-11-04 DIAGNOSIS — D696 Thrombocytopenia, unspecified: Secondary | ICD-10-CM | POA: Diagnosis not present

## 2018-11-11 DIAGNOSIS — D696 Thrombocytopenia, unspecified: Secondary | ICD-10-CM | POA: Diagnosis not present

## 2018-11-11 DIAGNOSIS — F411 Generalized anxiety disorder: Secondary | ICD-10-CM | POA: Diagnosis not present

## 2018-11-11 DIAGNOSIS — I493 Ventricular premature depolarization: Secondary | ICD-10-CM | POA: Diagnosis not present

## 2018-11-11 DIAGNOSIS — K7460 Unspecified cirrhosis of liver: Secondary | ICD-10-CM | POA: Diagnosis not present

## 2018-11-23 DIAGNOSIS — M79662 Pain in left lower leg: Secondary | ICD-10-CM | POA: Diagnosis not present

## 2018-11-23 DIAGNOSIS — M25562 Pain in left knee: Secondary | ICD-10-CM | POA: Diagnosis not present

## 2018-12-07 DIAGNOSIS — M25562 Pain in left knee: Secondary | ICD-10-CM | POA: Diagnosis not present

## 2018-12-13 DIAGNOSIS — M25662 Stiffness of left knee, not elsewhere classified: Secondary | ICD-10-CM | POA: Diagnosis not present

## 2018-12-13 DIAGNOSIS — M6281 Muscle weakness (generalized): Secondary | ICD-10-CM | POA: Diagnosis not present

## 2018-12-13 DIAGNOSIS — M1712 Unilateral primary osteoarthritis, left knee: Secondary | ICD-10-CM | POA: Diagnosis not present

## 2019-02-16 DIAGNOSIS — K7460 Unspecified cirrhosis of liver: Secondary | ICD-10-CM | POA: Diagnosis not present

## 2019-02-18 DIAGNOSIS — K7460 Unspecified cirrhosis of liver: Secondary | ICD-10-CM | POA: Diagnosis not present

## 2019-03-11 DIAGNOSIS — Z1159 Encounter for screening for other viral diseases: Secondary | ICD-10-CM | POA: Diagnosis not present

## 2019-03-16 DIAGNOSIS — Z86010 Personal history of colonic polyps: Secondary | ICD-10-CM | POA: Diagnosis not present

## 2019-03-16 DIAGNOSIS — K5520 Angiodysplasia of colon without hemorrhage: Secondary | ICD-10-CM | POA: Diagnosis not present

## 2019-03-16 DIAGNOSIS — D128 Benign neoplasm of rectum: Secondary | ICD-10-CM | POA: Diagnosis not present

## 2019-03-16 DIAGNOSIS — K3189 Other diseases of stomach and duodenum: Secondary | ICD-10-CM | POA: Diagnosis not present

## 2019-03-16 DIAGNOSIS — K5730 Diverticulosis of large intestine without perforation or abscess without bleeding: Secondary | ICD-10-CM | POA: Diagnosis not present

## 2019-03-16 DIAGNOSIS — I8510 Secondary esophageal varices without bleeding: Secondary | ICD-10-CM | POA: Diagnosis not present

## 2019-03-16 DIAGNOSIS — K269 Duodenal ulcer, unspecified as acute or chronic, without hemorrhage or perforation: Secondary | ICD-10-CM | POA: Diagnosis not present

## 2019-03-16 DIAGNOSIS — K766 Portal hypertension: Secondary | ICD-10-CM | POA: Diagnosis not present

## 2019-03-16 DIAGNOSIS — D123 Benign neoplasm of transverse colon: Secondary | ICD-10-CM | POA: Diagnosis not present

## 2019-03-16 DIAGNOSIS — K759 Inflammatory liver disease, unspecified: Secondary | ICD-10-CM | POA: Diagnosis not present

## 2019-05-31 DIAGNOSIS — Z23 Encounter for immunization: Secondary | ICD-10-CM | POA: Diagnosis not present

## 2019-06-01 DIAGNOSIS — D696 Thrombocytopenia, unspecified: Secondary | ICD-10-CM | POA: Diagnosis not present

## 2019-06-01 DIAGNOSIS — M109 Gout, unspecified: Secondary | ICD-10-CM | POA: Diagnosis not present

## 2019-06-01 DIAGNOSIS — K766 Portal hypertension: Secondary | ICD-10-CM | POA: Diagnosis not present

## 2019-06-01 DIAGNOSIS — K7460 Unspecified cirrhosis of liver: Secondary | ICD-10-CM | POA: Diagnosis not present

## 2019-06-07 DIAGNOSIS — D696 Thrombocytopenia, unspecified: Secondary | ICD-10-CM | POA: Diagnosis not present

## 2019-06-07 DIAGNOSIS — M109 Gout, unspecified: Secondary | ICD-10-CM | POA: Diagnosis not present

## 2019-06-07 DIAGNOSIS — K766 Portal hypertension: Secondary | ICD-10-CM | POA: Diagnosis not present

## 2019-10-18 ENCOUNTER — Other Ambulatory Visit: Payer: Self-pay

## 2019-10-18 MED ORDER — CLOBETASOL PROPIONATE 0.05 % EX FOAM: Freq: Every day | CUTANEOUS | 5 refills | Status: AC

## 2019-10-18 NOTE — Telephone Encounter (Signed)
Received fax for denial of Lexette.  Per Dr. Denna Haggard okay to change Rx to Clobetasol Foam.

## 2019-12-05 DIAGNOSIS — F411 Generalized anxiety disorder: Secondary | ICD-10-CM | POA: Diagnosis not present

## 2020-01-18 DIAGNOSIS — H5203 Hypermetropia, bilateral: Secondary | ICD-10-CM | POA: Diagnosis not present

## 2020-02-09 DIAGNOSIS — H5203 Hypermetropia, bilateral: Secondary | ICD-10-CM | POA: Diagnosis not present

## 2020-03-30 DIAGNOSIS — D696 Thrombocytopenia, unspecified: Secondary | ICD-10-CM | POA: Diagnosis not present

## 2020-03-30 DIAGNOSIS — Z0000 Encounter for general adult medical examination without abnormal findings: Secondary | ICD-10-CM | POA: Diagnosis not present

## 2020-03-30 DIAGNOSIS — Z23 Encounter for immunization: Secondary | ICD-10-CM | POA: Diagnosis not present

## 2020-05-09 DIAGNOSIS — U071 COVID-19: Secondary | ICD-10-CM | POA: Diagnosis not present

## 2020-05-12 ENCOUNTER — Other Ambulatory Visit (HOSPITAL_COMMUNITY): Payer: Self-pay | Admitting: Adult Health

## 2020-05-12 ENCOUNTER — Ambulatory Visit (HOSPITAL_COMMUNITY)
Admission: RE | Admit: 2020-05-12 | Discharge: 2020-05-12 | Disposition: A | Discharge status: Home / Self Care | Payer: Medicare Other | Source: Ambulatory Visit | Attending: Pulmonary Disease | Admitting: Pulmonary Disease

## 2020-05-12 DIAGNOSIS — U071 COVID-19: Secondary | ICD-10-CM | POA: Diagnosis present

## 2020-05-12 DIAGNOSIS — Z23 Encounter for immunization: Secondary | ICD-10-CM | POA: Diagnosis not present

## 2020-05-12 MED ORDER — FAMOTIDINE IN NACL 20-0.9 MG/50ML-% IV SOLN
20.0000 mg | Freq: Once | INTRAVENOUS | Status: AC | PRN
  Administered 2020-05-12: 20 mg via INTRAVENOUS
  Filled 2020-05-12: qty 50

## 2020-05-12 MED ORDER — SODIUM CHLORIDE 0.9 % IV SOLN: INTRAVENOUS | Status: DC | PRN

## 2020-05-12 MED ORDER — METHYLPREDNISOLONE SODIUM SUCC 125 MG IJ SOLR
125.0000 mg | Freq: Once | INTRAMUSCULAR | Status: AC | PRN
  Administered 2020-05-12: 125 mg via INTRAVENOUS
  Filled 2020-05-12: qty 2

## 2020-05-12 MED ORDER — SODIUM CHLORIDE 0.9 % IV SOLN: Freq: Once | INTRAVENOUS | Status: AC

## 2020-05-12 MED ORDER — DIPHENHYDRAMINE HCL 50 MG/ML IJ SOLN: 50.0000 mg | Freq: Once | INTRAMUSCULAR | Status: DC | PRN

## 2020-05-12 MED ORDER — SODIUM CHLORIDE 0.9 % IV SOLN: Freq: Once | INTRAVENOUS | Status: DC

## 2020-05-12 MED ORDER — ALBUTEROL SULFATE HFA 108 (90 BASE) MCG/ACT IN AERS: 2.0000 | INHALATION_SPRAY | Freq: Once | RESPIRATORY_TRACT | Status: DC | PRN

## 2020-05-12 MED ORDER — EPINEPHRINE 0.3 MG/0.3ML IJ SOAJ: 0.3000 mg | Freq: Once | INTRAMUSCULAR | Status: DC | PRN

## 2020-05-12 NOTE — Progress Notes (Signed)
I connected by phone with Johnny Coleman on 05/12/2020 at 7:53 AM to discuss the potential use of a new treatment for mild to moderate COVID-19 viral infection in non-hospitalized patients.  This patient is a 64 y.o. male that meets the FDA criteria for Emergency Use Authorization of COVID monoclonal antibody casirivimab/imdevimab or bamlanivimab/eteseviamb.  Has a (+) direct SARS-CoV-2 viral test result  Has mild or moderate COVID-19   Is NOT hospitalized due to COVID-19  Is within 10 days of symptom onset  Has at least one of the high risk factor(s) for progression to severe COVID-19 and/or hospitalization as defined in EUA.  Specific high risk criteria : Older age (>/= 65 yo)   Sx onset 10/4   I have spoken and communicated the following to the patient or parent/caregiver regarding COVID monoclonal antibody treatment:  1. FDA has authorized the emergency use for the treatment of mild to moderate COVID-19 in adults and pediatric patients with positive results of direct SARS-CoV-2 viral testing who are 61 years of age and older weighing at least 40 kg, and who are at high risk for progressing to severe COVID-19 and/or hospitalization.  2. The significant known and potential risks and benefits of COVID monoclonal antibody, and the extent to which such potential risks and benefits are unknown.  3. Information on available alternative treatments and the risks and benefits of those alternatives, including clinical trials.  4. Patients treated with COVID monoclonal antibody should continue to self-isolate and use infection control measures (e.g., wear mask, isolate, social distance, avoid sharing personal items, clean and disinfect "high touch" surfaces, and frequent handwashing) according to CDC guidelines.   5. The patient or parent/caregiver has the option to accept or refuse COVID monoclonal antibody treatment.  After reviewing this information with the patient, the patient has agreed  to receive one of the available covid 19 monoclonal antibodies and will be provided an appropriate fact sheet prior to infusion. Scot Dock, NP 05/12/2020 7:53 AM

## 2020-05-12 NOTE — Discharge Instructions (Signed)

## 2020-05-12 NOTE — Progress Notes (Signed)
Patient was started on the Casi infusion and 5 mins into the infusion he started having lower back pain and midsternum chest pain. The medication was immediately stopped and Np Thedore Mins was notified. Verbal orders were given to take vitals, administer the standing prn medications pepcid and solu medrol. Once medications administered continue the casi infusion.

## 2020-05-12 NOTE — Progress Notes (Signed)
  Diagnosis: COVID-19  Physician: Dr. Joya Gaskins   Procedure: [facility] Infusion [facility] Med: casirivimab\imdevimab infusion - Provided patient with casirivimab\imdevimab fact sheet for patients, parents and caregivers prior to infusion.  Complications: No immediate complications noted. See previous note from 1230pm 05/12/20  Discharge: Discharged home   Johnny Coleman 05/12/2020

## 2020-05-15 ENCOUNTER — Other Ambulatory Visit (HOSPITAL_COMMUNITY): Payer: Self-pay

## 2020-05-28 IMAGING — CT CT ABD-PELV W/ CM
2 of 5 series · 16 of 46 positions shown, 18 images · IV contrast (APPLIED)
Comparison: CT abdomen pelvis dated December 04, 2015.

CLINICAL DATA: Sudden onset mid abdominal pain for the past 2 hours
with vomiting.

EXAM:
CT ABDOMEN AND PELVIS WITH CONTRAST
TECHNIQUE: Multidetector CT imaging of the abdomen and pelvis was performed
using the standard protocol following bolus administration of
intravenous contrast.
CONTRAST:  100mL HZTKG5-PAA IOPAMIDOL (HZTKG5-PAA) INJECTION 61%

[Series 2: axial st · axial · 0.89mm/px · z∈[+367,+817]mm · 13 of 102 slices shown, 15 images]
[im 6/102  soft-tissue]
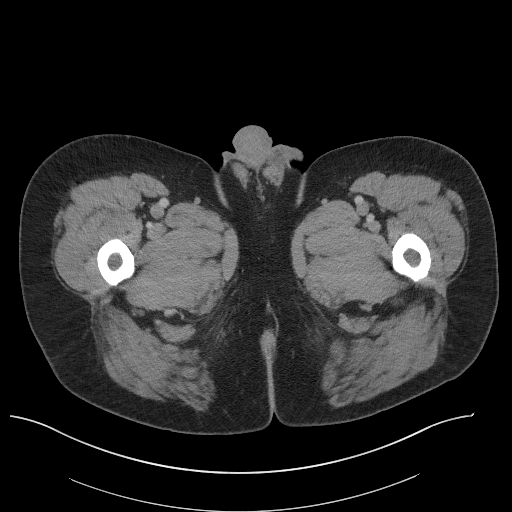
[im 6/102  bone]
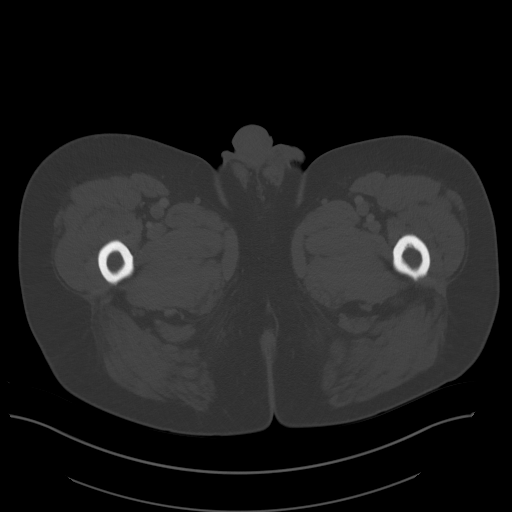
[im 16/102  soft-tissue]
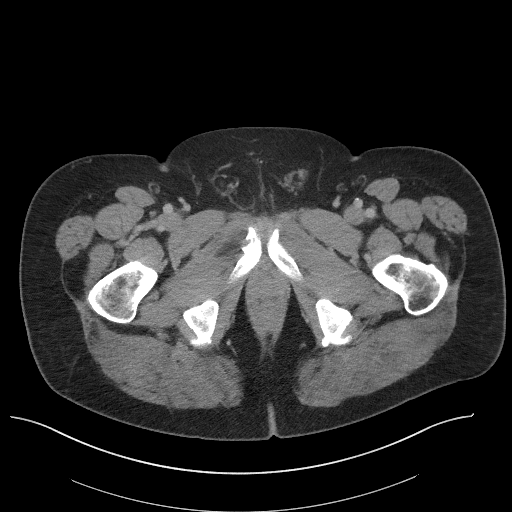
[im 22/102  soft-tissue]
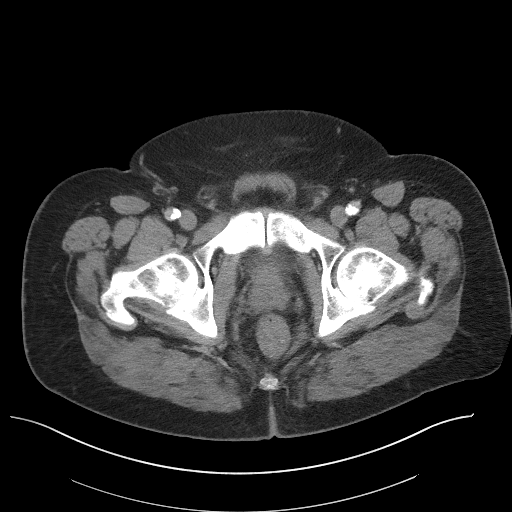
[im 27/102  soft-tissue]
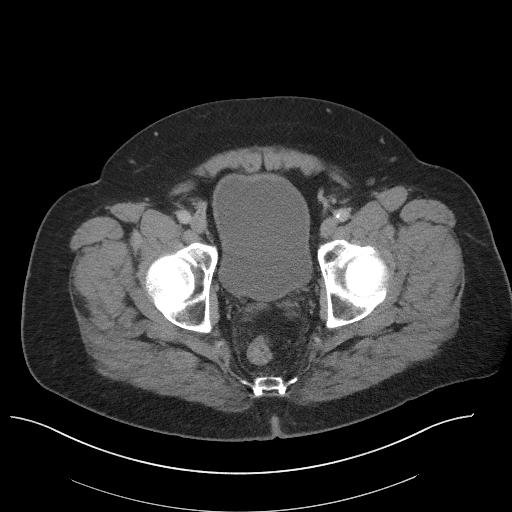
[im 38/102  soft-tissue]
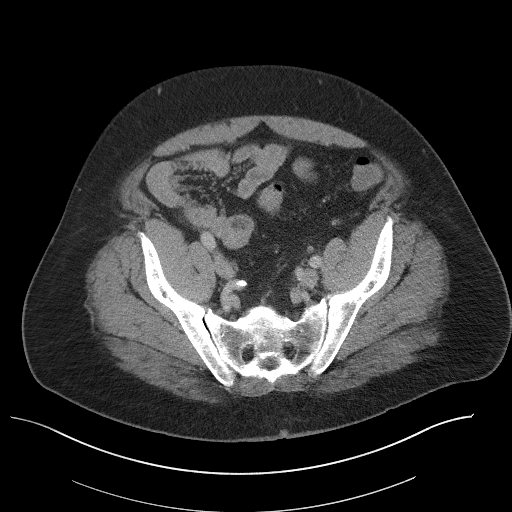
[im 43/102  soft-tissue]
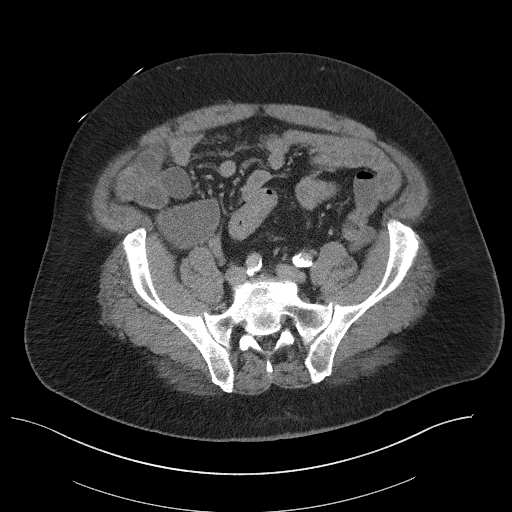
[im 54/102  soft-tissue]
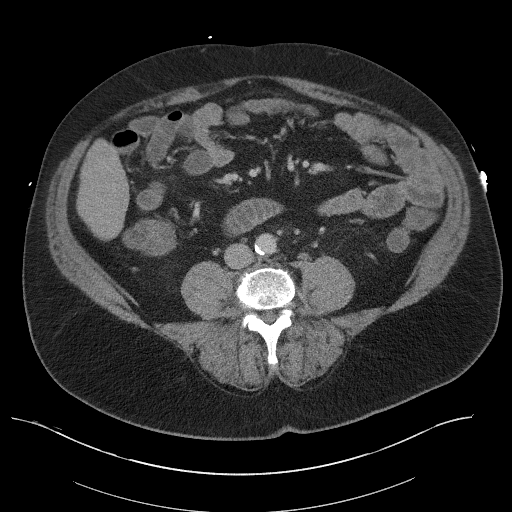
[im 59/102  soft-tissue]
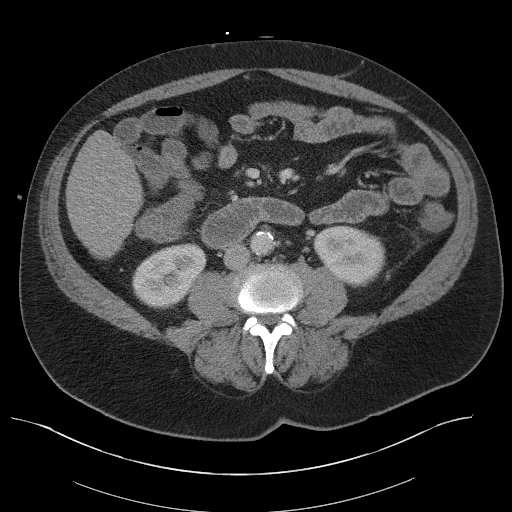
[im 64/102  soft-tissue]
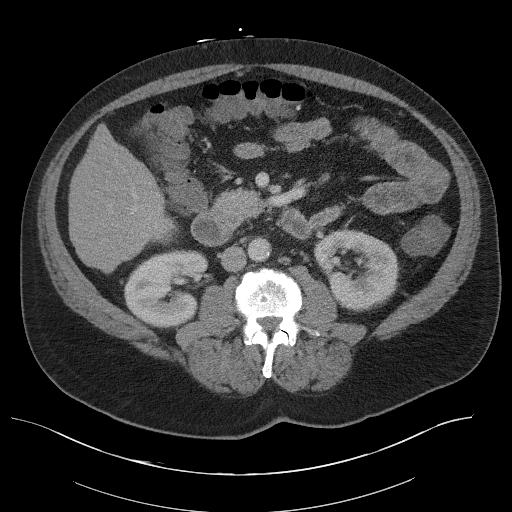
[im 64/102  bone]
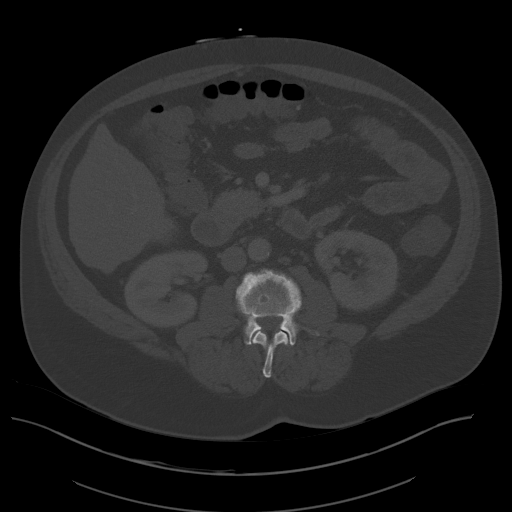
[im 75/102  soft-tissue]
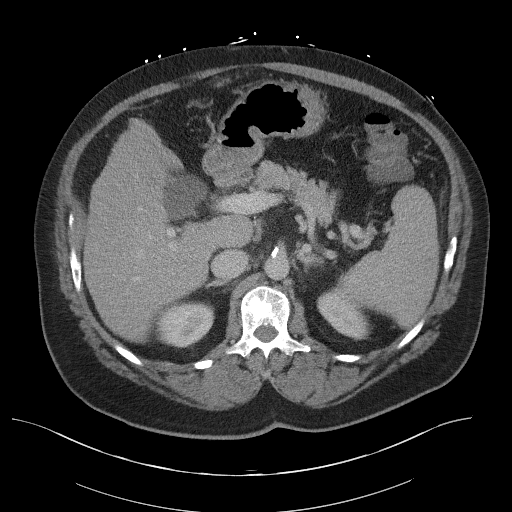
[im 80/102  soft-tissue]
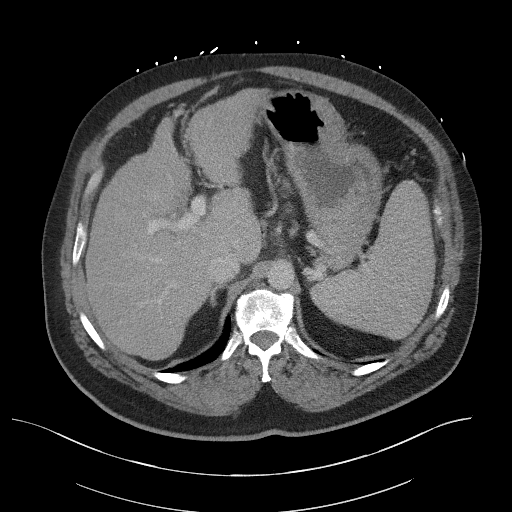
[im 86/102  soft-tissue]
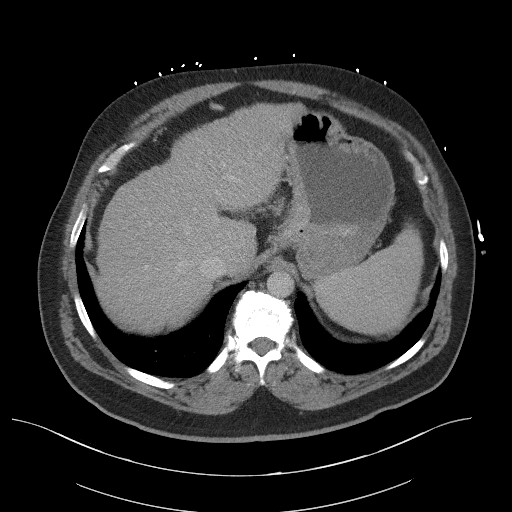
[im 96/102  soft-tissue]
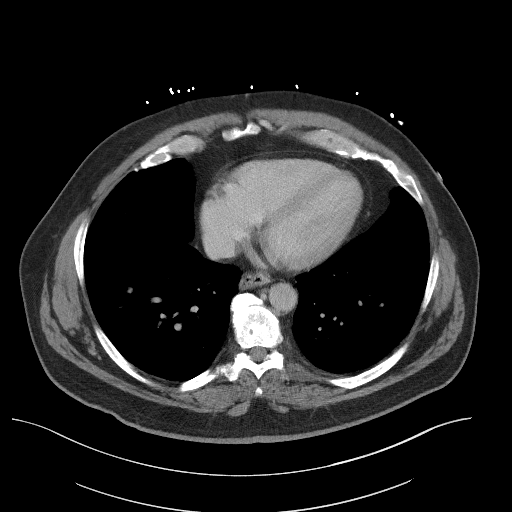

[Series 5: coronal st · coronal · 0.89mm/px · 3 of 112 slices shown]
[im 38/112  soft-tissue]
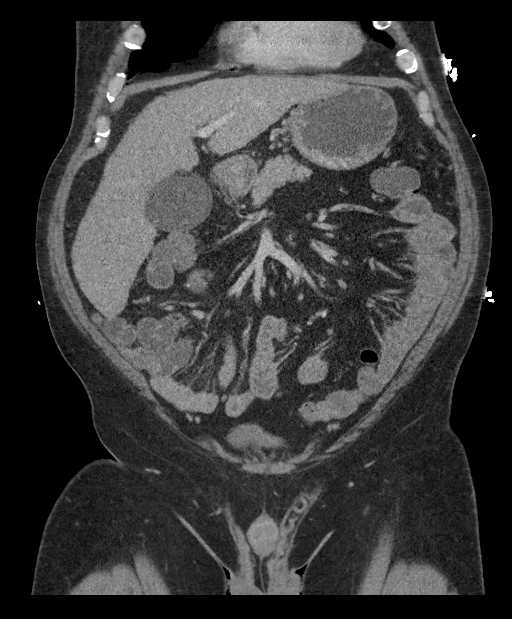
[im 50/112  soft-tissue]
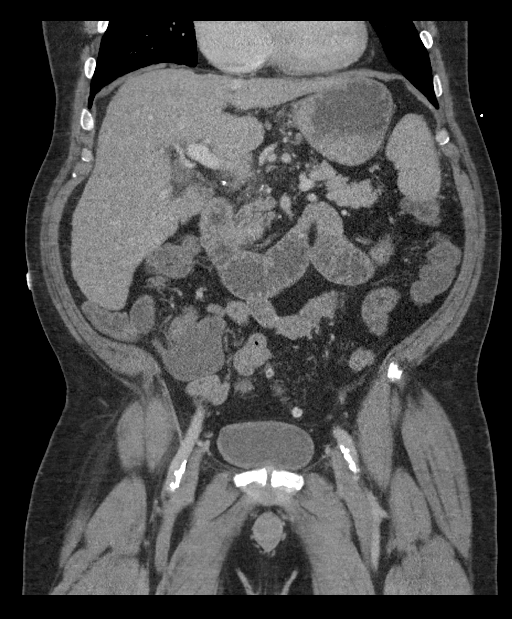
[im 62/112  soft-tissue]
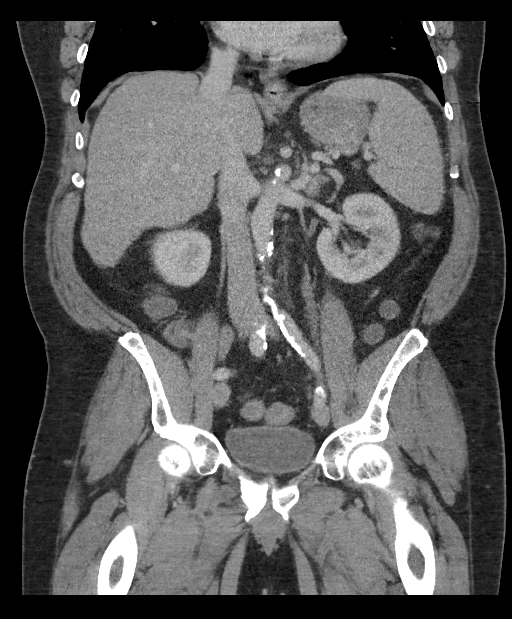

[16 of 46 positions shown; findings below may reference images not displayed]

FINDINGS: Lower chest: No acute abnormality.

Hepatobiliary: Unchanged cirrhotic liver. No focal liver
abnormality. The gallbladder is mildly distended and contains a tiny
gallstone. No gallbladder wall thickening or biliary dilatation.

Pancreas: Unremarkable. No pancreatic ductal dilatation or
surrounding inflammatory changes.

Spleen: Normal in size without focal abnormality.

Adrenals/Urinary Tract: Adrenal glands are unremarkable. Kidneys are
normal, without renal calculi, focal lesion, or hydronephrosis.
Bladder is unremarkable.

Stomach/Bowel: Stomach is within normal limits. Appendix appears
normal. No evidence of bowel wall thickening, distention, or
inflammatory changes. Fluid-filled colon. Mild sigmoid
diverticulosis.

Vascular/Lymphatic: Unchanged splenorenal shunt and perigastric
varices. Aortic atherosclerosis. No enlarged abdominal or pelvic
lymph nodes.

Reproductive: Prostate is unremarkable.

Other: No abdominal wall hernia or abnormality. No abdominopelvic
ascites. No pneumoperitoneum.

Musculoskeletal: No acute or significant osseous findings. New
Schmorl's node involving the L5 superior endplate.
IMPRESSION: 1.  No acute intra-abdominal process.
2. Cirrhosis with evidence of portal hypertension.  No ascites.
3. Cholelithiasis.
4.  Aortic atherosclerosis ([id number]).

## 2021-01-16 ENCOUNTER — Other Ambulatory Visit: Payer: Self-pay

## 2021-01-16 ENCOUNTER — Encounter: Payer: Self-pay | Admitting: Dermatology

## 2021-01-16 ENCOUNTER — Ambulatory Visit: Payer: Medicare Other | Admitting: Dermatology

## 2021-01-16 DIAGNOSIS — C44319 Basal cell carcinoma of skin of other parts of face: Secondary | ICD-10-CM | POA: Diagnosis not present

## 2021-01-16 DIAGNOSIS — Z1283 Encounter for screening for malignant neoplasm of skin: Secondary | ICD-10-CM

## 2021-01-16 DIAGNOSIS — D485 Neoplasm of uncertain behavior of skin: Secondary | ICD-10-CM | POA: Diagnosis not present

## 2021-01-16 DIAGNOSIS — C4491 Basal cell carcinoma of skin, unspecified: Secondary | ICD-10-CM

## 2021-01-16 DIAGNOSIS — L821 Other seborrheic keratosis: Secondary | ICD-10-CM

## 2021-01-16 DIAGNOSIS — L400 Psoriasis vulgaris: Secondary | ICD-10-CM

## 2021-01-16 DIAGNOSIS — B359 Dermatophytosis, unspecified: Secondary | ICD-10-CM

## 2021-01-16 HISTORY — DX: Basal cell carcinoma of skin, unspecified: C44.91

## 2021-01-16 LAB — POCT SKIN KOH: Skin KOH, POC: NEGATIVE

## 2021-01-16 NOTE — Patient Instructions (Addendum)
Call in 1 month     Biopsy, Surgery (Curettage) & Surgery (Excision) Aftercare Instructions  1. Okay to remove bandage in 24 hours  2. Wash area with soap and water  3. Apply Vaseline to area twice daily until healed (Not Neosporin)  4. Okay to cover with a Band-Aid to decrease the chance of infection or prevent irritation from clothing; also it's okay to uncover lesion at home.  5. Suture instructions: return to our office in 7-10 or 10-14 days for a nurse visit for suture removal. Variable healing with sutures, if pain or itching occurs call our office. It's okay to shower or bathe 24 hours after sutures are given.  6. The following risks may occur after a biopsy, curettage or excision: bleeding, scarring, discoloration, recurrence, infection (redness, yellow drainage, pain or swelling).  7. For questions, concerns and results call our office at [place] before 4pm & Friday before 3pm. Biopsy results will be available in 1 week.

## 2021-01-21 ENCOUNTER — Encounter: Payer: Self-pay | Admitting: Dermatology

## 2021-01-21 NOTE — Progress Notes (Signed)
   Follow-Up Visit   Subjective  Johnny Coleman is a 65 y.o. male who presents for the following: Psoriasis (Right foot only x years treatment clobetasol foam due to price change he cant afford it now).  Rash on foot previously labeled as psoriasis and treated with clobetasol with some improvement. Location:  Duration:  Quality:  Associated Signs/Symptoms: Modifying Factors:  Severity:  Timing: Context:   Objective  Well appearing patient in no apparent distress; mood and affect are within normal limits. Multiple 2 to 10 mm brown flattopped textured papules  General skin examination, no atypical pigmented lesions.  Left Zygomatic Area Pearly pink 6 mm papule     Right Foot - Anterior Marginated papulosquamous 2cm spot; careful KOH did not prove tinea.  No sign of tinea toe webs or plantar.  Neck - Posterior Objectively, there is no typical psoriasis present today.  Obviously, I cannot exclude the possibility that this is in remission.    A full examination was performed including scalp, head, eyes, ears, nose, lips, neck, chest, axillae, abdomen, back, buttocks, bilateral upper extremities, bilateral lower extremities, hands, feet, fingers, toes, fingernails, and toenails. All findings within normal limits unless otherwise noted below.  Areas beneath undergarments not fully examined.   Assessment & Plan    Neoplasm of uncertain behavior of skin Left Zygomatic Area  Skin / nail biopsy Type of biopsy: tangential   Informed consent: discussed and consent obtained   Timeout: patient name, date of birth, surgical site, and procedure verified   Anesthesia: the lesion was anesthetized in a standard fashion   Anesthetic:  1% lidocaine w/ epinephrine 1-100,000 local infiltration Instrument used: flexible razor blade   Hemostasis achieved with: ferric subsulfate   Outcome: patient tolerated procedure well   Post-procedure details: wound care instructions given     Specimen 1 - Surgical pathology Differential Diagnosis: scc vs bcc  Check Margins: No  Tinea Right Foot - Anterior  OTC clotrimazole cream apply daily after bathing; follow up in 1 month.   POCT Skin KOH - Right Foot - Anterior  Culture, fungus without smear - Right Foot - Anterior  Seborrheic keratosis  No intervention currently needed  Screening for malignant neoplasm of skin  Annual skin examination  Plaque psoriasis Neck - Posterior  No psoriasis treatment currently indicated.      I, Lavonna Monarch, MD, have reviewed all documentation for this visit.  The documentation on 01/21/21 for the exam, diagnosis, procedures, and orders are all accurate and complete.

## 2021-01-28 ENCOUNTER — Telehealth: Payer: Self-pay

## 2021-01-28 NOTE — Telephone Encounter (Signed)
Patient is returning phone call about pathology results.

## 2021-01-28 NOTE — Telephone Encounter (Signed)
Phone call to patient with his pathology results. Voicemail left for patient to give the office a call back.  ?

## 2021-01-28 NOTE — Telephone Encounter (Signed)
-----   Message from Lavonna Monarch, MD sent at 01/24/2021  7:32 PM EDT ----- Schedule surgery with Dr. Darene Lamer

## 2021-01-29 NOTE — Telephone Encounter (Signed)
Phone call to patient with his pathology results. Patient aware of results.  

## 2021-03-04 LAB — CULTURE, FUNGUS WITHOUT SMEAR
MICRO NUMBER:: 12010813
SPECIMEN QUALITY:: ADEQUATE

## 2021-03-07 ENCOUNTER — Telehealth: Payer: Self-pay

## 2021-03-07 DIAGNOSIS — H52221 Regular astigmatism, right eye: Secondary | ICD-10-CM | POA: Diagnosis not present

## 2021-03-07 DIAGNOSIS — H25811 Combined forms of age-related cataract, right eye: Secondary | ICD-10-CM | POA: Diagnosis not present

## 2021-03-07 NOTE — Telephone Encounter (Signed)
-----   Message from Lavonna Monarch, MD sent at 03/04/2021  7:34 PM EDT ----- I did research.  Although dematiaceous fungus may occasionally cause toenail infections, it would rarely involve the skin of immunocompetent patients I do not feel this result requires intervention.

## 2021-03-07 NOTE — Telephone Encounter (Signed)
Phone call to patient with his culture results and Dr. Onalee Hua recommendations. Patient aware.

## 2021-03-21 ENCOUNTER — Encounter: Payer: Self-pay | Admitting: Dermatology

## 2021-03-21 ENCOUNTER — Ambulatory Visit (INDEPENDENT_AMBULATORY_CARE_PROVIDER_SITE_OTHER): Payer: Medicare Other | Admitting: Dermatology

## 2021-03-21 ENCOUNTER — Other Ambulatory Visit: Payer: Self-pay

## 2021-03-21 DIAGNOSIS — C44319 Basal cell carcinoma of skin of other parts of face: Secondary | ICD-10-CM

## 2021-03-21 DIAGNOSIS — C4491 Basal cell carcinoma of skin, unspecified: Secondary | ICD-10-CM

## 2021-03-21 NOTE — Patient Instructions (Signed)

## 2021-03-22 DIAGNOSIS — H25811 Combined forms of age-related cataract, right eye: Secondary | ICD-10-CM | POA: Diagnosis not present

## 2021-03-22 DIAGNOSIS — H2511 Age-related nuclear cataract, right eye: Secondary | ICD-10-CM | POA: Diagnosis not present

## 2021-03-22 DIAGNOSIS — H40051 Ocular hypertension, right eye: Secondary | ICD-10-CM | POA: Diagnosis not present

## 2021-03-22 DIAGNOSIS — H409 Unspecified glaucoma: Secondary | ICD-10-CM | POA: Diagnosis not present

## 2021-03-28 ENCOUNTER — Other Ambulatory Visit: Payer: Self-pay

## 2021-03-28 ENCOUNTER — Ambulatory Visit (INDEPENDENT_AMBULATORY_CARE_PROVIDER_SITE_OTHER): Payer: Medicare Other

## 2021-03-28 DIAGNOSIS — Z4802 Encounter for removal of sutures: Secondary | ICD-10-CM

## 2021-04-04 ENCOUNTER — Encounter: Payer: Self-pay | Admitting: Dermatology

## 2021-04-04 NOTE — Progress Notes (Signed)
   Follow-Up Visit   Subjective  Johnny Coleman is a 65 y.o. male who presents for the following: Procedure (Superficial plus nodular BCC.).  BCC left cheek Location:  Duration:  Quality:  Associated Signs/Symptoms: Modifying Factors:  Severity:  Timing: Context:   Objective  Well appearing patient in no apparent distress; mood and affect are within normal limits. Left Zygomatic Area Biopsy site identified by patient, nurse, and me.    A focused examination was performed including head and neck. Relevant physical exam findings are noted in the Assessment and Plan.   Assessment & Plan    Basal cell carcinoma (BCC), unspecified site Left Zygomatic Area  Destruction of lesion Complexity: simple   Destruction method: electrodesiccation and curettage   Informed consent: discussed and consent obtained   Timeout:  patient name, date of birth, surgical site, and procedure verified Anesthesia: the lesion was anesthetized in a standard fashion   Anesthetic:  1% lidocaine w/ epinephrine 1-100,000 local infiltration Curettage performed in three different directions: Yes   Curettage cycles:  3 Final wound size (cm):  1.2 Hemostasis achieved with:  ferric subsulfate Outcome: patient tolerated procedure well with no complications   Additional details:  Wound innoculated with 5 fluorouracil solution.  Skin excision  Lesion length (cm):  0.7 Lesion width (cm):  0.7 Margin per side (cm):  0.1 Total excision diameter (cm):  0.9 Informed consent: discussed and consent obtained   Timeout: patient name, date of birth, surgical site, and procedure verified   Anesthesia: the lesion was anesthetized in a standard fashion   Anesthetic:  1% lidocaine w/ epinephrine 1-100,000 local infiltration Instrument used: #15 blade   Hemostasis achieved with: pressure and electrodesiccation   Outcome: patient tolerated procedure well with no complications   Post-procedure details: sterile  dressing applied and wound care instructions given   Dressing type: bandage, petrolatum and pressure dressing   Additional details:  5-0 vicryl x 0 5-0 Ethilon x 4  Skin repair Complexity:  Intermediate Final length (cm):  1.4 Informed consent: discussed and consent obtained   Timeout: patient name, date of birth, surgical site, and procedure verified   Procedure prep:  Patient was prepped and draped in usual sterile fashion Prep type:  Chlorhexidine Reason for type of repair: reduce tension to allow closure, reduce the risk of dehiscence, infection, and necrosis and enhance both functionality and cosmetic results   Undermining: edges could be approximated without difficulty   Subcutaneous layers (deep stitches):  Suture size:  5-0 Suture type: Vicryl (polyglactin 910)   Fine/surface layer approximation (top stitches):  Suture size:  5-0 Suture type: nylon   Suture removal (days):  7 Hemostasis achieved with: suture Outcome: patient tolerated procedure well with no complications   Post-procedure details: sterile dressing applied   Dressing type: petrolatum    Specimen 1 - Surgical pathology Differential Diagnosis: bcc [place]:[id number] Superior Margin stained  Check Margins: No  Curettage showed this to be a fairly deep lesion so after cautery of the base and margins plus repeat curette, narrow margin excision was performed followed by layered closure.  Follow-up 1 week.      I, Lavonna Monarch, MD, have reviewed all documentation for this visit.  The documentation on 04/04/21 for the exam, diagnosis, procedures, and orders are all accurate and complete.

## 2021-04-09 DIAGNOSIS — M109 Gout, unspecified: Secondary | ICD-10-CM | POA: Diagnosis not present

## 2021-04-09 DIAGNOSIS — D696 Thrombocytopenia, unspecified: Secondary | ICD-10-CM | POA: Diagnosis not present

## 2021-04-09 DIAGNOSIS — Z125 Encounter for screening for malignant neoplasm of prostate: Secondary | ICD-10-CM | POA: Diagnosis not present

## 2021-04-09 DIAGNOSIS — I10 Essential (primary) hypertension: Secondary | ICD-10-CM | POA: Diagnosis not present

## 2021-04-09 DIAGNOSIS — E781 Pure hyperglyceridemia: Secondary | ICD-10-CM | POA: Diagnosis not present

## 2021-04-12 DIAGNOSIS — M109 Gout, unspecified: Secondary | ICD-10-CM | POA: Diagnosis not present

## 2021-04-12 DIAGNOSIS — Z0000 Encounter for general adult medical examination without abnormal findings: Secondary | ICD-10-CM | POA: Diagnosis not present

## 2021-04-12 DIAGNOSIS — D696 Thrombocytopenia, unspecified: Secondary | ICD-10-CM | POA: Diagnosis not present

## 2021-04-19 DIAGNOSIS — H40052 Ocular hypertension, left eye: Secondary | ICD-10-CM | POA: Diagnosis not present

## 2021-04-19 DIAGNOSIS — H25812 Combined forms of age-related cataract, left eye: Secondary | ICD-10-CM | POA: Diagnosis not present

## 2021-04-19 DIAGNOSIS — H40053 Ocular hypertension, bilateral: Secondary | ICD-10-CM | POA: Diagnosis not present

## 2021-08-16 DIAGNOSIS — R45851 Suicidal ideations: Secondary | ICD-10-CM | POA: Diagnosis not present

## 2021-08-16 DIAGNOSIS — G4489 Other headache syndrome: Secondary | ICD-10-CM | POA: Diagnosis not present

## 2021-08-16 DIAGNOSIS — G4700 Insomnia, unspecified: Secondary | ICD-10-CM | POA: Diagnosis not present

## 2021-08-16 DIAGNOSIS — F411 Generalized anxiety disorder: Secondary | ICD-10-CM | POA: Diagnosis not present

## 2021-10-02 ENCOUNTER — Ambulatory Visit: Payer: Medicare Other | Admitting: Dermatology

## 2021-10-14 DIAGNOSIS — F411 Generalized anxiety disorder: Secondary | ICD-10-CM | POA: Diagnosis not present

## 2021-10-14 DIAGNOSIS — G4700 Insomnia, unspecified: Secondary | ICD-10-CM | POA: Diagnosis not present

## 2021-10-14 DIAGNOSIS — I8500 Esophageal varices without bleeding: Secondary | ICD-10-CM | POA: Diagnosis not present

## 2021-12-11 ENCOUNTER — Ambulatory Visit: Payer: Medicare Other | Admitting: Dermatology

## 2022-01-22 ENCOUNTER — Ambulatory Visit: Payer: Medicare Other | Admitting: Dermatology

## 2022-01-22 ENCOUNTER — Encounter: Payer: Self-pay | Admitting: Dermatology

## 2022-01-22 DIAGNOSIS — L821 Other seborrheic keratosis: Secondary | ICD-10-CM | POA: Diagnosis not present

## 2022-01-22 DIAGNOSIS — C4492 Squamous cell carcinoma of skin, unspecified: Secondary | ICD-10-CM

## 2022-01-22 DIAGNOSIS — C44311 Basal cell carcinoma of skin of nose: Secondary | ICD-10-CM

## 2022-01-22 DIAGNOSIS — Z85828 Personal history of other malignant neoplasm of skin: Secondary | ICD-10-CM

## 2022-01-22 DIAGNOSIS — D0439 Carcinoma in situ of skin of other parts of face: Secondary | ICD-10-CM | POA: Diagnosis not present

## 2022-01-22 DIAGNOSIS — Z1283 Encounter for screening for malignant neoplasm of skin: Secondary | ICD-10-CM

## 2022-01-22 DIAGNOSIS — L570 Actinic keratosis: Secondary | ICD-10-CM | POA: Diagnosis not present

## 2022-01-22 DIAGNOSIS — C4491 Basal cell carcinoma of skin, unspecified: Secondary | ICD-10-CM

## 2022-01-22 DIAGNOSIS — D485 Neoplasm of uncertain behavior of skin: Secondary | ICD-10-CM

## 2022-01-22 HISTORY — DX: Basal cell carcinoma of skin, unspecified: C44.91

## 2022-01-22 HISTORY — DX: Squamous cell carcinoma of skin, unspecified: C44.92

## 2022-01-22 NOTE — Patient Instructions (Signed)

## 2022-01-25 DIAGNOSIS — H8110 Benign paroxysmal vertigo, unspecified ear: Secondary | ICD-10-CM | POA: Diagnosis not present

## 2022-01-25 DIAGNOSIS — H6122 Impacted cerumen, left ear: Secondary | ICD-10-CM | POA: Diagnosis not present

## 2022-01-28 ENCOUNTER — Telehealth: Payer: Self-pay

## 2022-02-18 ENCOUNTER — Encounter: Payer: Self-pay | Admitting: Dermatology

## 2022-02-18 NOTE — Progress Notes (Signed)
Follow-Up Visit   Subjective  Johnny Coleman is a 66 y.o. male who presents for the following: Annual Exam (Yearly skin check, personal history of bcc ).  Annual examination, several new crusted areas Location:  Duration:  Quality:  Associated Signs/Symptoms: Modifying Factors:  Severity:  Timing: Context:   Objective  Well appearing patient in no apparent distress; mood and affect are within normal limits. Full body skin examination: No atypical pigmented lesions (all checked with dermoscopy), no recurrent nonmelanoma skin cancer.  3 possible new nonmelanoma skin cancers will be biopsied.  Left Temple Ill-defined waxy 9 mm crust       Left Ala Nasi Focally eroded pearly 5 mm indented papule       Left Malar Cheek Pink waxy slightly verrucous crust 5 mm  Left Preauricular Area, Left Temple, Mid Frontal Scalp (3) Half dozen gritty to hornlike 2 to 3 mm pink crusts    All skin waist up examined.   Assessment & Plan    Encounter for screening for malignant neoplasm of skin  Neoplasm of uncertain behavior of skin (3) Left Temple  Skin / nail biopsy Type of biopsy: tangential   Informed consent: discussed and consent obtained   Timeout: patient name, date of birth, surgical site, and procedure verified   Procedure prep:  Patient was prepped and draped in usual sterile fashion (Non sterile) Prep type:  Chlorhexidine Anesthesia: the lesion was anesthetized in a standard fashion   Anesthetic:  1% lidocaine w/ epinephrine 1-100,000 local infiltration Instrument used: flexible razor blade   Outcome: patient tolerated procedure well   Post-procedure details: wound care instructions given    Specimen 1 - Surgical pathology Differential Diagnosis: bcc vs scc  Check Margins: No  Left Ala Nasi  Skin / nail biopsy Type of biopsy: tangential   Informed consent: discussed and consent obtained   Timeout: patient name, date of birth, surgical site, and  procedure verified   Procedure prep:  Patient was prepped and draped in usual sterile fashion (Non sterile) Prep type:  Chlorhexidine Anesthesia: the lesion was anesthetized in a standard fashion   Anesthetic:  1% lidocaine w/ epinephrine 1-100,000 local infiltration Instrument used: flexible razor blade   Outcome: patient tolerated procedure well   Post-procedure details: wound care instructions given    Specimen 2 - Surgical pathology Differential Diagnosis: bcc vs scc  Check Margins: No  Left Malar Cheek  Skin / nail biopsy Type of biopsy: tangential   Informed consent: discussed and consent obtained   Timeout: patient name, date of birth, surgical site, and procedure verified   Procedure prep:  Patient was prepped and draped in usual sterile fashion (Non sterile) Prep type:  Chlorhexidine Anesthesia: the lesion was anesthetized in a standard fashion   Anesthetic:  1% lidocaine w/ epinephrine 1-100,000 local infiltration Instrument used: flexible razor blade   Outcome: patient tolerated procedure well   Post-procedure details: wound care instructions given    Specimen 3 - Surgical pathology Differential Diagnosis: r/o sk  Check Margins: No  AK (actinic keratosis) (5) Mid Frontal Scalp (3); Left Temple; Left Preauricular Area  Destruction of lesion - Left Preauricular Area, Left Temple, Mid Frontal Scalp Complexity: simple   Destruction method: cryotherapy   Informed consent: discussed and consent obtained   Timeout:  patient name, date of birth, surgical site, and procedure verified Lesion destroyed using liquid nitrogen: Yes   Cryotherapy cycles:  3 Outcome: patient tolerated procedure well with no complications  I, Lavonna Monarch, MD, have reviewed all documentation for this visit.  The documentation on 02/18/22 for the exam, diagnosis, procedures, and orders are all accurate and complete.

## 2022-04-21 DIAGNOSIS — E781 Pure hyperglyceridemia: Secondary | ICD-10-CM | POA: Diagnosis not present

## 2022-04-21 DIAGNOSIS — D696 Thrombocytopenia, unspecified: Secondary | ICD-10-CM | POA: Diagnosis not present

## 2022-04-21 DIAGNOSIS — K7460 Unspecified cirrhosis of liver: Secondary | ICD-10-CM | POA: Diagnosis not present

## 2022-04-21 DIAGNOSIS — M109 Gout, unspecified: Secondary | ICD-10-CM | POA: Diagnosis not present

## 2022-04-28 DIAGNOSIS — Z0000 Encounter for general adult medical examination without abnormal findings: Secondary | ICD-10-CM | POA: Diagnosis not present

## 2022-04-28 DIAGNOSIS — D696 Thrombocytopenia, unspecified: Secondary | ICD-10-CM | POA: Diagnosis not present

## 2022-04-28 DIAGNOSIS — K766 Portal hypertension: Secondary | ICD-10-CM | POA: Diagnosis not present

## 2022-04-28 DIAGNOSIS — N400 Enlarged prostate without lower urinary tract symptoms: Secondary | ICD-10-CM | POA: Diagnosis not present

## 2022-05-01 DIAGNOSIS — C44311 Basal cell carcinoma of skin of nose: Secondary | ICD-10-CM | POA: Diagnosis not present

## 2022-05-01 DIAGNOSIS — C4491 Basal cell carcinoma of skin, unspecified: Secondary | ICD-10-CM | POA: Diagnosis not present

## 2022-08-21 DIAGNOSIS — C44319 Basal cell carcinoma of skin of other parts of face: Secondary | ICD-10-CM | POA: Diagnosis not present

## 2022-08-21 DIAGNOSIS — L814 Other melanin hyperpigmentation: Secondary | ICD-10-CM | POA: Diagnosis not present

## 2022-08-21 DIAGNOSIS — D485 Neoplasm of uncertain behavior of skin: Secondary | ICD-10-CM | POA: Diagnosis not present

## 2022-08-21 DIAGNOSIS — D225 Melanocytic nevi of trunk: Secondary | ICD-10-CM | POA: Diagnosis not present

## 2022-08-21 DIAGNOSIS — L821 Other seborrheic keratosis: Secondary | ICD-10-CM | POA: Diagnosis not present

## 2022-08-21 DIAGNOSIS — L570 Actinic keratosis: Secondary | ICD-10-CM | POA: Diagnosis not present

## 2022-09-11 DIAGNOSIS — C44319 Basal cell carcinoma of skin of other parts of face: Secondary | ICD-10-CM | POA: Diagnosis not present

## 2022-10-09 DIAGNOSIS — H524 Presbyopia: Secondary | ICD-10-CM | POA: Diagnosis not present

## 2022-10-29 DIAGNOSIS — H6123 Impacted cerumen, bilateral: Secondary | ICD-10-CM | POA: Diagnosis not present

## 2022-10-29 DIAGNOSIS — H9313 Tinnitus, bilateral: Secondary | ICD-10-CM | POA: Diagnosis not present

## 2022-10-29 DIAGNOSIS — H903 Sensorineural hearing loss, bilateral: Secondary | ICD-10-CM | POA: Diagnosis not present

## 2023-01-08 DIAGNOSIS — M170 Bilateral primary osteoarthritis of knee: Secondary | ICD-10-CM | POA: Diagnosis not present

## 2023-01-16 DIAGNOSIS — M170 Bilateral primary osteoarthritis of knee: Secondary | ICD-10-CM | POA: Diagnosis not present

## 2023-01-23 DIAGNOSIS — M170 Bilateral primary osteoarthritis of knee: Secondary | ICD-10-CM | POA: Diagnosis not present

## 2023-01-30 DIAGNOSIS — M170 Bilateral primary osteoarthritis of knee: Secondary | ICD-10-CM | POA: Diagnosis not present

## 2023-05-01 DIAGNOSIS — I700 Atherosclerosis of aorta: Secondary | ICD-10-CM | POA: Diagnosis not present

## 2023-05-01 DIAGNOSIS — M109 Gout, unspecified: Secondary | ICD-10-CM | POA: Diagnosis not present

## 2023-05-01 DIAGNOSIS — D696 Thrombocytopenia, unspecified: Secondary | ICD-10-CM | POA: Diagnosis not present

## 2023-05-01 DIAGNOSIS — Z125 Encounter for screening for malignant neoplasm of prostate: Secondary | ICD-10-CM | POA: Diagnosis not present

## 2023-05-01 DIAGNOSIS — K7460 Unspecified cirrhosis of liver: Secondary | ICD-10-CM | POA: Diagnosis not present

## 2023-05-07 DIAGNOSIS — K766 Portal hypertension: Secondary | ICD-10-CM | POA: Diagnosis not present

## 2023-05-07 DIAGNOSIS — Z23 Encounter for immunization: Secondary | ICD-10-CM | POA: Diagnosis not present

## 2023-05-07 DIAGNOSIS — Z0000 Encounter for general adult medical examination without abnormal findings: Secondary | ICD-10-CM | POA: Diagnosis not present

## 2023-05-07 DIAGNOSIS — D696 Thrombocytopenia, unspecified: Secondary | ICD-10-CM | POA: Diagnosis not present

## 2023-05-07 DIAGNOSIS — I700 Atherosclerosis of aorta: Secondary | ICD-10-CM | POA: Diagnosis not present

## 2023-06-25 DIAGNOSIS — Z800 Family history of malignant neoplasm of digestive organs: Secondary | ICD-10-CM | POA: Diagnosis not present

## 2023-06-25 DIAGNOSIS — Z09 Encounter for follow-up examination after completed treatment for conditions other than malignant neoplasm: Secondary | ICD-10-CM | POA: Diagnosis not present

## 2023-06-25 DIAGNOSIS — Z860100 Personal history of colon polyps, unspecified: Secondary | ICD-10-CM | POA: Diagnosis not present

## 2023-06-25 DIAGNOSIS — K5730 Diverticulosis of large intestine without perforation or abscess without bleeding: Secondary | ICD-10-CM | POA: Diagnosis not present

## 2023-06-25 DIAGNOSIS — D123 Benign neoplasm of transverse colon: Secondary | ICD-10-CM | POA: Diagnosis not present

## 2023-06-29 DIAGNOSIS — D123 Benign neoplasm of transverse colon: Secondary | ICD-10-CM | POA: Diagnosis not present

## 2023-07-15 DIAGNOSIS — M170 Bilateral primary osteoarthritis of knee: Secondary | ICD-10-CM | POA: Diagnosis not present

## 2023-11-05 ENCOUNTER — Ambulatory Visit (INDEPENDENT_AMBULATORY_CARE_PROVIDER_SITE_OTHER): Payer: Medicare Other | Admitting: Otolaryngology

## 2023-11-05 ENCOUNTER — Encounter (INDEPENDENT_AMBULATORY_CARE_PROVIDER_SITE_OTHER): Payer: Self-pay

## 2023-11-05 VITALS — BP 133/71 | HR 80 | Ht 70.0 in | Wt 235.0 lb

## 2023-11-05 DIAGNOSIS — H903 Sensorineural hearing loss, bilateral: Secondary | ICD-10-CM | POA: Diagnosis not present

## 2023-11-05 DIAGNOSIS — H6123 Impacted cerumen, bilateral: Secondary | ICD-10-CM | POA: Diagnosis not present

## 2023-11-05 DIAGNOSIS — H9313 Tinnitus, bilateral: Secondary | ICD-10-CM

## 2023-11-05 DIAGNOSIS — J310 Chronic rhinitis: Secondary | ICD-10-CM | POA: Diagnosis not present

## 2023-11-05 DIAGNOSIS — H9319 Tinnitus, unspecified ear: Secondary | ICD-10-CM | POA: Diagnosis not present

## 2023-11-06 DIAGNOSIS — I8500 Esophageal varices without bleeding: Secondary | ICD-10-CM | POA: Diagnosis not present

## 2023-11-06 DIAGNOSIS — K7460 Unspecified cirrhosis of liver: Secondary | ICD-10-CM | POA: Diagnosis not present

## 2023-11-06 DIAGNOSIS — R110 Nausea: Secondary | ICD-10-CM | POA: Diagnosis not present

## 2023-11-07 DIAGNOSIS — H9313 Tinnitus, bilateral: Secondary | ICD-10-CM | POA: Insufficient documentation

## 2023-11-07 DIAGNOSIS — J310 Chronic rhinitis: Secondary | ICD-10-CM | POA: Insufficient documentation

## 2023-11-07 DIAGNOSIS — H903 Sensorineural hearing loss, bilateral: Secondary | ICD-10-CM | POA: Insufficient documentation

## 2023-11-07 DIAGNOSIS — H6123 Impacted cerumen, bilateral: Secondary | ICD-10-CM | POA: Insufficient documentation

## 2023-11-07 NOTE — Progress Notes (Signed)
 Patient ID: Johnny Coleman, male   DOB: [birth year], 68 y.o.   MRN: [id number]  Follow-up: Bilateral tinnitus, hearing loss  HPI: The patient is a 68 year old male who returns today for his follow-up evaluation.  The patient was last seen 1 year ago.  At that time, he was complaining of bilateral tinnitus.  The tinnitus was constant and nonpulsatile.  He was noted to have bilateral high-frequency sensorineural hearing loss.  The patient returns today complaining of persistent bilateral tinnitus.  He denies any change in his hearing.  He also complains of increasing nasal congestion and rhinorrhea over the past month, which he attributes to environmental allergies.  Currently he denies any otalgia, otorrhea, vertigo, facial pain, or fever.  Exam: General: Communicates without difficulty, well nourished, no acute distress. Head: Normocephalic, no evidence injury, no tenderness, facial buttresses intact without stepoff. Face/sinus: No tenderness to palpation and percussion. Facial movement is normal and symmetric. Eyes: PERRL, EOMI. No scleral icterus, conjunctivae clear. Neuro: CN II exam reveals vision grossly intact.  No nystagmus at any point of gaze. EAC: Bilateral cerumen impaction.  Under the operating microscope, the cerumen is carefully removed with a combination of cerumen currette, alligator forceps, and suction catheters.  After the cerumen is removed, the TMs are noted to be normal. Nose: External evaluation reveals normal support and skin without lesions.  Dorsum is intact.  Anterior rhinoscopy reveals congested mucosa over anterior aspect of inferior turbinates and intact septum.  No purulence noted. Oral:  Oral cavity and oropharynx are intact, symmetric, without erythema or edema.  Mucosa is moist without lesions. Neck: Full range of motion without pain.  There is no significant lymphadenopathy.  No masses palpable.  Thyroid bed within normal limits to palpation.  Parotid glands and submandibular  glands equal bilaterally without mass.  Trachea is midline. Neuro:  CN 2-12 grossly intact.   Procedure: Bilateral cerumen disimpaction Anesthesia: None Description: Under the operating microscope, the cerumen is carefully removed with a combination of cerumen currette, alligator forceps, and suction catheters.  After the cerumen is removed, the TMs are noted to be normal.  No mass, erythema, or lesions. The patient tolerated the procedure well.   Assessment: 1.  Bilateral cerumen impaction.  After the disimpaction procedure, both tympanic membranes and middle ear spaces are noted to be normal. 2.  Subjectively stable bilateral high-frequency sensorineural hearing loss. 3.  The patient's tinnitus is likely a direct result of his hearing loss. 4.  Chronic/allergic rhinitis, with congested nasal mucosa.  Plan:  1.  Otomicroscopy with bilateral cerumen disimpaction. 2.  The physical exam findings are reviewed with the patient.  3.  The strategies to cope with tinnitus, including the use of masker, hearing aids, tinnitus retraining therapy, and avoidance of caffeine and alcohol are discussed.  4.  Flonase nasal spray 2 sprays each nostril daily.  Allegra as needed. 5.  The patient will return for reevaluation in 1 year.

## 2023-11-20 DIAGNOSIS — H524 Presbyopia: Secondary | ICD-10-CM | POA: Diagnosis not present

## 2023-12-09 DIAGNOSIS — K7689 Other specified diseases of liver: Secondary | ICD-10-CM | POA: Diagnosis not present

## 2023-12-09 DIAGNOSIS — K7460 Unspecified cirrhosis of liver: Secondary | ICD-10-CM | POA: Diagnosis not present

## 2023-12-09 DIAGNOSIS — R161 Splenomegaly, not elsewhere classified: Secondary | ICD-10-CM | POA: Diagnosis not present

## 2023-12-11 DIAGNOSIS — M1712 Unilateral primary osteoarthritis, left knee: Secondary | ICD-10-CM | POA: Diagnosis not present

## 2024-01-06 DIAGNOSIS — K7460 Unspecified cirrhosis of liver: Secondary | ICD-10-CM | POA: Diagnosis not present

## 2024-01-06 DIAGNOSIS — Z13810 Encounter for screening for upper gastrointestinal disorder: Secondary | ICD-10-CM | POA: Diagnosis not present

## 2024-01-06 DIAGNOSIS — K3189 Other diseases of stomach and duodenum: Secondary | ICD-10-CM | POA: Diagnosis not present

## 2024-01-06 DIAGNOSIS — I8510 Secondary esophageal varices without bleeding: Secondary | ICD-10-CM | POA: Diagnosis not present

## 2024-01-06 DIAGNOSIS — K766 Portal hypertension: Secondary | ICD-10-CM | POA: Diagnosis not present

## 2024-03-01 DIAGNOSIS — K080 Exfoliation of teeth due to systemic causes: Secondary | ICD-10-CM | POA: Diagnosis not present

## 2024-04-20 DIAGNOSIS — K080 Exfoliation of teeth due to systemic causes: Secondary | ICD-10-CM | POA: Diagnosis not present

## 2024-04-27 DIAGNOSIS — Z08 Encounter for follow-up examination after completed treatment for malignant neoplasm: Secondary | ICD-10-CM | POA: Diagnosis not present

## 2024-04-27 DIAGNOSIS — D1801 Hemangioma of skin and subcutaneous tissue: Secondary | ICD-10-CM | POA: Diagnosis not present

## 2024-04-27 DIAGNOSIS — L814 Other melanin hyperpigmentation: Secondary | ICD-10-CM | POA: Diagnosis not present

## 2024-04-27 DIAGNOSIS — L821 Other seborrheic keratosis: Secondary | ICD-10-CM | POA: Diagnosis not present

## 2024-04-27 DIAGNOSIS — L570 Actinic keratosis: Secondary | ICD-10-CM | POA: Diagnosis not present

## 2024-04-28 DIAGNOSIS — K080 Exfoliation of teeth due to systemic causes: Secondary | ICD-10-CM | POA: Diagnosis not present

## 2024-05-05 DIAGNOSIS — Z125 Encounter for screening for malignant neoplasm of prostate: Secondary | ICD-10-CM | POA: Diagnosis not present

## 2024-05-05 DIAGNOSIS — M5412 Radiculopathy, cervical region: Secondary | ICD-10-CM | POA: Diagnosis not present

## 2024-05-05 DIAGNOSIS — M109 Gout, unspecified: Secondary | ICD-10-CM | POA: Diagnosis not present

## 2024-05-05 DIAGNOSIS — M5416 Radiculopathy, lumbar region: Secondary | ICD-10-CM | POA: Diagnosis not present

## 2024-05-05 DIAGNOSIS — E781 Pure hyperglyceridemia: Secondary | ICD-10-CM | POA: Diagnosis not present

## 2024-05-05 DIAGNOSIS — Z79899 Other long term (current) drug therapy: Secondary | ICD-10-CM | POA: Diagnosis not present

## 2024-05-05 DIAGNOSIS — D696 Thrombocytopenia, unspecified: Secondary | ICD-10-CM | POA: Diagnosis not present

## 2024-05-06 DIAGNOSIS — M1711 Unilateral primary osteoarthritis, right knee: Secondary | ICD-10-CM | POA: Diagnosis not present

## 2024-05-11 DIAGNOSIS — Z0000 Encounter for general adult medical examination without abnormal findings: Secondary | ICD-10-CM | POA: Diagnosis not present

## 2024-05-11 DIAGNOSIS — Z23 Encounter for immunization: Secondary | ICD-10-CM | POA: Diagnosis not present

## 2024-05-11 DIAGNOSIS — M1990 Unspecified osteoarthritis, unspecified site: Secondary | ICD-10-CM | POA: Diagnosis not present

## 2024-05-11 DIAGNOSIS — M109 Gout, unspecified: Secondary | ICD-10-CM | POA: Diagnosis not present

## 2024-05-11 DIAGNOSIS — K766 Portal hypertension: Secondary | ICD-10-CM | POA: Diagnosis not present

## 2024-05-11 DIAGNOSIS — F430 Acute stress reaction: Secondary | ICD-10-CM | POA: Diagnosis not present

## 2024-05-13 DIAGNOSIS — M1711 Unilateral primary osteoarthritis, right knee: Secondary | ICD-10-CM | POA: Diagnosis not present

## 2024-05-20 DIAGNOSIS — M1711 Unilateral primary osteoarthritis, right knee: Secondary | ICD-10-CM | POA: Diagnosis not present

## 2024-06-03 DIAGNOSIS — M545 Low back pain, unspecified: Secondary | ICD-10-CM | POA: Diagnosis not present

## 2024-06-15 DIAGNOSIS — M4726 Other spondylosis with radiculopathy, lumbar region: Secondary | ICD-10-CM | POA: Diagnosis not present

## 2024-06-22 DIAGNOSIS — M1712 Unilateral primary osteoarthritis, left knee: Secondary | ICD-10-CM | POA: Diagnosis not present

## 2024-07-06 DIAGNOSIS — D3701 Neoplasm of uncertain behavior of lip: Secondary | ICD-10-CM | POA: Diagnosis not present
